# Patient Record
Sex: Male | Born: 1975 | Race: White | Hispanic: No | Smoking: Current every day smoker
Health system: Southern US, Community
[De-identification: ages and names within clinical notes are randomized; demographics above are authoritative.]

## PROBLEM LIST (undated history)

## (undated) DIAGNOSIS — E119 Type 2 diabetes mellitus without complications: Secondary | ICD-10-CM

## (undated) DIAGNOSIS — Z5900 Homelessness unspecified: Secondary | ICD-10-CM

## (undated) DIAGNOSIS — F10929 Alcohol use, unspecified with intoxication, unspecified: Secondary | ICD-10-CM

## (undated) DIAGNOSIS — F4310 Post-traumatic stress disorder, unspecified: Secondary | ICD-10-CM

---

## 2021-02-12 ENCOUNTER — Other Ambulatory Visit: Payer: Self-pay

## 2021-02-12 ENCOUNTER — Encounter (HOSPITAL_COMMUNITY): Payer: Self-pay | Admitting: Emergency Medicine

## 2021-02-12 ENCOUNTER — Ambulatory Visit (HOSPITAL_COMMUNITY)
Admission: EM | Admit: 2021-02-12 | Discharge: 2021-02-12 | Disposition: A | Discharge status: Home / Self Care | Payer: No Payment, Other | Attending: Psychiatry | Admitting: Psychiatry

## 2021-02-12 ENCOUNTER — Emergency Department (HOSPITAL_COMMUNITY)
Admission: EM | Admit: 2021-02-12 | Discharge: 2021-02-13 | Disposition: A | Discharge status: Home / Self Care | Payer: Self-pay | Attending: Emergency Medicine | Admitting: Emergency Medicine

## 2021-02-12 DIAGNOSIS — F320 Major depressive disorder, single episode, mild: Secondary | ICD-10-CM

## 2021-02-12 DIAGNOSIS — F17200 Nicotine dependence, unspecified, uncomplicated: Secondary | ICD-10-CM | POA: Insufficient documentation

## 2021-02-12 DIAGNOSIS — F1910 Other psychoactive substance abuse, uncomplicated: Secondary | ICD-10-CM | POA: Insufficient documentation

## 2021-02-12 DIAGNOSIS — F1994 Other psychoactive substance use, unspecified with psychoactive substance-induced mood disorder: Secondary | ICD-10-CM

## 2021-02-12 DIAGNOSIS — R7401 Elevation of levels of liver transaminase levels: Secondary | ICD-10-CM | POA: Insufficient documentation

## 2021-02-12 DIAGNOSIS — K625 Hemorrhage of anus and rectum: Secondary | ICD-10-CM

## 2021-02-12 DIAGNOSIS — Z5900 Homelessness unspecified: Secondary | ICD-10-CM | POA: Insufficient documentation

## 2021-02-12 DIAGNOSIS — K649 Unspecified hemorrhoids: Secondary | ICD-10-CM | POA: Insufficient documentation

## 2021-02-12 NOTE — BH Assessment (Signed)
Patient reported with friend escorted by [facility] seeking outpatient detox services. Patient denies SI/ HI/ AVH . Patient reports he is homeless, originally from [place] and  recently left [employer] [facility] [place]. Patient has completed several detox short term programs, his last was [facility] in [place] , [place]. Patient is a veteran but has no family support. Patient is routine .

## 2021-02-12 NOTE — Discharge Instructions (Signed)
Take all medications as prescribed. Keep all follow-up appointments as scheduled.  Do not consume alcohol or use illegal drugs while on prescription medications. Report any adverse effects from your medications to your primary care provider promptly.  In the event of recurrent symptoms or worsening symptoms, call 911, a crisis hotline, or go to the nearest emergency department for evaluation.   

## 2021-02-12 NOTE — ED Provider Notes (Signed)
Behavioral Health Urgent Care Medical Screening Exam  Patient Name: Francisco Houston MRN: [id number] Date of Evaluation: 02/12/21 Chief Complaint:   Diagnosis:  Final diagnoses:  None    History of Present illness: Francisco Houston is a 45 y.o. male.  Presents accompanied by a friend in [employer] seeking additional outpatient resources for substance abuse.  He reports previous inpatient admissions for alcohol use.  Reported he and his friend left sober living of [place] and is currently looking for a place to sleep.  He denied suicidal or homicidal ideations.  Denies auditory or visual hallucinations.     [name] reported he recently completed detox stay at [facility] [place].  Patient reports he is originally from [place] and has no family or friends locally.  St[facility].  Denies that he is currently followed by therapy and/or psychiatry.  Patient reports drinking alcohol daily.  Patient was provided with additional outpatient resources for homeless shelters, alcohol drug services ([facility] and  [facility].    Psychiatric Specialty Exam  Presentation  General Appearance:Appropriate for Environment  Eye Contact:Good  Speech:Clear and Coherent  Speech Volume:Normal  Handedness:Right   Mood and Affect  Mood:Anxious  Affect:Congruent   Thought Process  Thought Processes:Coherent  Descriptions of Associations:Intact  Orientation:Full (Time, Place and Person)  Thought Content:Logical    Hallucinations:None  Ideas of Reference:Other (comment)  Suicidal Thoughts:No  Homicidal Thoughts:No   Sensorium  Memory:Recent Fair; Immediate Fair; Remote Fair  Judgment:Intact  Insight:Poor   Executive Functions  Concentration:Poor  Attention Span:Fair  Recall:Good  Fund of Knowledge:Good  Language:Good   Psychomotor Activity  Psychomotor Activity:Normal   Assets  Assets:Communication Skills; Social Support   Sleep   Sleep:Fair  Number of hours: No data recorded  Nutritional Assessment (For OBS and FBC admissions only) Has the patient had a weight loss or gain of 10 pounds or more in the last 3 months?: No Has the patient had a decrease in food intake/or appetite?: No Does the patient have dental problems?: No Does the patient have eating habits or behaviors that may be indicators of an eating disorder including binging or inducing vomiting?: No Has the patient recently lost weight without trying?: No Has the patient been eating poorly because of a decreased appetite?: No Malnutrition Screening Tool Score: 0    Physical Exam: Physical Exam ROS Blood pressure 128/86, pulse 94, temperature 98 F (36.7 C), temperature source Oral, resp. rate 16, SpO2 97 %. There is no height or weight on file to calculate BMI.  Musculoskeletal: Strength & Muscle Tone: within normal limits Gait & Station: normal Patient leans: N/A   [facility] MSE Discharge Disposition for Follow up and Recommendations: Based on my evaluation the patient does not appear to have an emergency medical condition and can be discharged with resources and follow up care in outpatient services for Medication Management and Substance Abuse Intensive Outpatient Program   Oneta Rack, NP 02/12/2021, 7:13 PM

## 2021-02-12 NOTE — ED Triage Notes (Signed)
Emergency Medicine Provider Triage Evaluation Note  Francisco Houston , a 45 y.o. male  was evaluated in triage.  Pt complains of BRBPR.  He reports 10 BM of this today.  He is also homeless, and her requesting alcohol detox.  He reports prior seizures when withdrawing.  He was seen at [facility] earlier and felt not to require admission.   Review of Systems  Positive: BRBPR, Alcohol use Negative: syncope  Physical Exam  BP (!) 136/97 (BP Location: Left Arm)   Pulse 85   Temp (!) 97.2 F (36.2 C) (Oral)   Resp 20   SpO2 99%  Gen:   Awake, no distress   HEENT:  Atraumatic  Resp:  Normal effort  Cardiac:  Normal rate  Abd:   Nondistended, nontender  MSK:   Moves extremities without difficulty  Neuro:  Speech clear   Medical Decision Making  Medically screening exam initiated at 11:47 PM.  Appropriate orders placed.  Francisco Houston was informed that the remainder of the evaluation will be completed by another provider, this initial triage assessment does not replace that evaluation, and the importance of remaining in the ED until their evaluation is complete.  Clinical Impression  BRBPR, alcohol use.    Cristina Gong, [place] 02/12/21 2350

## 2021-02-12 NOTE — ED Triage Notes (Signed)
Patient requesting detox for his alcoholism , last drink today , denies SI or HI , no hallucinations .  

## 2021-02-13 LAB — CBC WITH DIFFERENTIAL/PLATELET
Abs Immature Granulocytes: 0.02 10*3/uL (ref 0.00–0.07)
Basophils Absolute: 0.1 10*3/uL (ref 0.0–0.1)
Basophils Relative: 1 %
Eosinophils Absolute: 0.2 10*3/uL (ref 0.0–0.5)
Eosinophils Relative: 3 %
HCT: 48 % (ref 39.0–52.0)
Hemoglobin: 16.2 g/dL (ref 13.0–17.0)
Immature Granulocytes: 0 %
Lymphocytes Relative: 33 %
Lymphs Abs: 2.4 10*3/uL (ref 0.7–4.0)
MCH: 33.3 pg (ref 26.0–34.0)
MCHC: 33.8 g/dL (ref 30.0–36.0)
MCV: 98.8 fL (ref 80.0–100.0)
Monocytes Absolute: 0.6 10*3/uL (ref 0.1–1.0)
Monocytes Relative: 9 %
Neutro Abs: 4 10*3/uL (ref 1.7–7.7)
Neutrophils Relative %: 54 %
Platelets: 270 10*3/uL (ref 150–400)
RBC: 4.86 MIL/uL (ref 4.22–5.81)
RDW: 12.2 % (ref 11.5–15.5)
WBC: 7.3 10*3/uL (ref 4.0–10.5)
nRBC: 0 % (ref 0.0–0.2)

## 2021-02-13 LAB — COMPREHENSIVE METABOLIC PANEL
ALT: 116 U/L — ABNORMAL HIGH (ref 0–44)
AST: 183 U/L — ABNORMAL HIGH (ref 15–41)
Albumin: 3.4 g/dL — ABNORMAL LOW (ref 3.5–5.0)
Alkaline Phosphatase: 71 U/L (ref 38–126)
Anion gap: 12 (ref 5–15)
BUN: <5 mg/dL — ABNORMAL LOW (ref 6–20)
CO2: 23 mmol/L (ref 22–32)
Calcium: 9 mg/dL (ref 8.9–10.3)
Chloride: 104 mmol/L (ref 98–111)
Creatinine, Ser: 0.71 mg/dL (ref 0.61–1.24)
GFR, Estimated: >60 mL/min (ref >60)
Glucose, Bld: 94 mg/dL (ref 70–99)
Potassium: 3.6 mmol/L (ref 3.5–5.1)
Sodium: 139 mmol/L (ref 135–145)
Total Bilirubin: 0.6 mg/dL (ref 0.3–1.2)
Total Protein: 7.3 g/dL (ref 6.5–8.1)

## 2021-02-13 LAB — RAPID URINE DRUG SCREEN, HOSP PERFORMED
Amphetamines: NOT DETECTED
Barbiturates: NOT DETECTED
Benzodiazepines: NOT DETECTED
Cocaine: POSITIVE — AB
Opiates: NOT DETECTED
Tetrahydrocannabinol: NOT DETECTED

## 2021-02-13 LAB — POC OCCULT BLOOD, ED: Fecal Occult Bld: POSITIVE — AB

## 2021-02-13 NOTE — ED Notes (Signed)
E-signature pad unavailable at time of pt discharge. This RN discussed discharge materials with pt and answered all pt questions. Pt stated understanding of discharge material. ? ?

## 2021-02-13 NOTE — ED Provider Notes (Signed)
[facility] EMERGENCY DEPARTMENT Provider Note  CSN: [id number] Arrival date & time: 02/12/21 2327    History Chief Complaint  Patient presents with  . Alcoholism ( Detox)  . Rectal Bleeding    Bright red blood    HPI  Francisco Houston is a 45 y.o. male presents for evaluation of rectal bleeding and requesting detox. Patient reports he has had 12 loose, watery stools today mixed with bright red blood. He is reporting diffuse lower abdominal pain.   Additionally he is requesting detox from alcohol and drugs. He and a friend (who is also a patient) recently left [facility] after being there for several weeks and began binging on drugs and alcohol the last 4 days. Patient went to [facility] where he was seen and told they do not offer inpatient detox, he was given community resources.    History reviewed. No pertinent past medical history.  History reviewed. No pertinent surgical history.  No family history on file.  Social History   Tobacco Use  . Smoking status: Current Every Day Smoker  . Smokeless tobacco: Never Used  Substance Use Topics  . Alcohol use: Yes  . Drug use: Yes    Types: Cocaine     Home Medications Prior to Admission medications   Not on File     Allergies    Patient has no known allergies.   Review of Systems   Review of Systems A comprehensive review of systems was completed and negative except as noted in HPI.    Physical Exam BP (!) 136/97 (BP Location: Left Arm)   Pulse 85   Temp (!) 97.2 F (36.2 C) (Oral)   Resp 20   SpO2 99%   Physical Exam Vitals and nursing note reviewed.  Constitutional:      Appearance: Normal appearance.  HENT:     Head: Normocephalic and atraumatic.     Nose: Nose normal.     Mouth/Throat:     Mouth: Mucous membranes are moist.  Eyes:     Extraocular Movements: Extraocular movements intact.     Conjunctiva/sclera: Conjunctivae normal.  Cardiovascular:     Rate and Rhythm: Normal rate.  Pulmonary:      Effort: Pulmonary effort is normal.     Breath sounds: Normal breath sounds.  Abdominal:     General: Abdomen is flat. There is no distension.     Palpations: Abdomen is soft. There is no mass.     Tenderness: There is abdominal tenderness (mild diffuse). There is no guarding.  Musculoskeletal:        General: No swelling. Normal range of motion.     Cervical back: Neck supple.  Skin:    General: Skin is warm and dry.  Neurological:     General: No focal deficit present.     Mental Status: He is alert.  Psychiatric:        Mood and Affect: Mood normal.      ED Results / Procedures / Treatments   Labs (all labs ordered are listed, but only abnormal results are displayed) Labs Reviewed  RAPID URINE DRUG SCREEN, HOSP PERFORMED - Abnormal; Notable for the following components:      Result Value   Cocaine POSITIVE (*)    All other components within normal limits  COMPREHENSIVE METABOLIC PANEL - Abnormal; Notable for the following components:   BUN <5 (*)    Albumin 3.4 (*)    AST 183 (*)    ALT 116 (*)  All other components within normal limits  POC OCCULT BLOOD, ED - Abnormal; Notable for the following components:   Fecal Occult Bld POSITIVE (*)    All other components within normal limits  CBC WITH DIFFERENTIAL/PLATELET    EKG None  Radiology No results found.  Procedures Procedures  Medications Ordered in the ED Medications - No data to display   MDM Rules/Calculators/A&P MDM I discussed with the patient that we do not offer inpatient detox services. He does not have any signs of acute withdrawal in need of medical admission. Offered to evaluate the patient for his rectal bleeding. Awaiting a private exam room for rectal exam.   ED Course  I have reviewed the triage vital signs and the nursing notes.  Pertinent labs & imaging results that were available during my care of the patient were reviewed by me and considered in my medical decision making (see  chart for details).  Clinical Course as of 02/13/21 0346  Mon Feb 13, 2021  0200 Patient with several large, non thrombosed hemorrhoids. Minimal stool in rectum, but hemoccult is positive. Will check CBC and CMP to determine need for acute medical admission. [CS]  0315 CBC is normal.  [CS]  0343 CMP with mildly elevated liver enzymes, likely due to alcohol use. Previous from visits at OSH similar.  [CS]    Clinical Course User Index [CS] Pollyann Savoy, MD    Final Clinical Impression(s) / ED Diagnoses Final diagnoses:  Substance abuse (HCC)  Hemorrhoids, unspecified hemorrhoid type  Rectal bleeding    Rx / DC Orders ED Discharge Orders    None       Pollyann Savoy, MD 02/13/21 [phone]

## 2021-02-20 ENCOUNTER — Other Ambulatory Visit: Payer: Self-pay

## 2021-02-20 ENCOUNTER — Emergency Department (HOSPITAL_COMMUNITY)
Admission: EM | Admit: 2021-02-20 | Discharge: 2021-02-20 | Discharge status: Against Medical Advice | Payer: Self-pay | Attending: Physician Assistant | Admitting: Physician Assistant

## 2021-02-20 ENCOUNTER — Encounter (HOSPITAL_COMMUNITY): Payer: Self-pay | Admitting: *Deleted

## 2021-02-20 ENCOUNTER — Emergency Department (HOSPITAL_COMMUNITY)
Admission: EM | Admit: 2021-02-20 | Discharge: 2021-02-20 | Disposition: A | Discharge status: Home / Self Care | Payer: Self-pay | Attending: Emergency Medicine | Admitting: Emergency Medicine

## 2021-02-20 ENCOUNTER — Encounter (HOSPITAL_COMMUNITY): Payer: Self-pay

## 2021-02-20 DIAGNOSIS — F17200 Nicotine dependence, unspecified, uncomplicated: Secondary | ICD-10-CM | POA: Insufficient documentation

## 2021-02-20 DIAGNOSIS — R569 Unspecified convulsions: Secondary | ICD-10-CM | POA: Insufficient documentation

## 2021-02-20 DIAGNOSIS — R000 Tachycardia, unspecified: Secondary | ICD-10-CM | POA: Insufficient documentation

## 2021-02-20 DIAGNOSIS — Z5900 Homelessness unspecified: Secondary | ICD-10-CM | POA: Insufficient documentation

## 2021-02-20 DIAGNOSIS — F10129 Alcohol abuse with intoxication, unspecified: Secondary | ICD-10-CM | POA: Insufficient documentation

## 2021-02-20 DIAGNOSIS — F10929 Alcohol use, unspecified with intoxication, unspecified: Secondary | ICD-10-CM | POA: Insufficient documentation

## 2021-02-20 DIAGNOSIS — Y908 Blood alcohol level of 240 mg/100 ml or more: Secondary | ICD-10-CM | POA: Insufficient documentation

## 2021-02-20 DIAGNOSIS — Z5321 Procedure and treatment not carried out due to patient leaving prior to being seen by health care provider: Secondary | ICD-10-CM | POA: Insufficient documentation

## 2021-02-20 DIAGNOSIS — E119 Type 2 diabetes mellitus without complications: Secondary | ICD-10-CM | POA: Insufficient documentation

## 2021-02-20 HISTORY — DX: Alcohol use, unspecified with intoxication, unspecified: F10.929

## 2021-02-20 HISTORY — DX: Type 2 diabetes mellitus without complications: E11.9

## 2021-02-20 HISTORY — DX: Homelessness unspecified: Z59.00

## 2021-02-20 HISTORY — DX: Post-traumatic stress disorder, unspecified: F43.10

## 2021-02-20 LAB — RAPID URINE DRUG SCREEN, HOSP PERFORMED
Amphetamines: NOT DETECTED
Barbiturates: NOT DETECTED
Benzodiazepines: NOT DETECTED
Cocaine: NOT DETECTED
Opiates: NOT DETECTED
Tetrahydrocannabinol: NOT DETECTED

## 2021-02-20 LAB — COMPREHENSIVE METABOLIC PANEL
ALT: 46 U/L — ABNORMAL HIGH (ref 0–44)
ALT: 47 U/L — ABNORMAL HIGH (ref 0–44)
AST: 48 U/L — ABNORMAL HIGH (ref 15–41)
AST: 51 U/L — ABNORMAL HIGH (ref 15–41)
Albumin: 4.1 g/dL (ref 3.5–5.0)
Albumin: 4.2 g/dL (ref 3.5–5.0)
Alkaline Phosphatase: 73 U/L (ref 38–126)
Alkaline Phosphatase: 73 U/L (ref 38–126)
Anion gap: 14 (ref 5–15)
Anion gap: 16 — ABNORMAL HIGH (ref 5–15)
BUN: 7 mg/dL (ref 6–20)
BUN: 6 mg/dL (ref 6–20)
CO2: 23 mmol/L (ref 22–32)
CO2: 20 mmol/L — ABNORMAL LOW (ref 22–32)
Calcium: 9.1 mg/dL (ref 8.9–10.3)
Calcium: 8.9 mg/dL (ref 8.9–10.3)
Chloride: 101 mmol/L (ref 98–111)
Chloride: 102 mmol/L (ref 98–111)
Creatinine, Ser: 0.84 mg/dL (ref 0.61–1.24)
Creatinine, Ser: 0.8 mg/dL (ref 0.61–1.24)
GFR, Estimated: >60 mL/min (ref >60)
GFR, Estimated: >60 mL/min (ref >60)
Glucose, Bld: 116 mg/dL — ABNORMAL HIGH (ref 70–99)
Glucose, Bld: 119 mg/dL — ABNORMAL HIGH (ref 70–99)
Potassium: 3.8 mmol/L (ref 3.5–5.1)
Potassium: 3.7 mmol/L (ref 3.5–5.1)
Sodium: 138 mmol/L (ref 135–145)
Sodium: 138 mmol/L (ref 135–145)
Total Bilirubin: 0.7 mg/dL (ref 0.3–1.2)
Total Bilirubin: 0.5 mg/dL (ref 0.3–1.2)
Total Protein: 8.5 g/dL — ABNORMAL HIGH (ref 6.5–8.1)
Total Protein: 8.4 g/dL — ABNORMAL HIGH (ref 6.5–8.1)

## 2021-02-20 LAB — CBC WITH DIFFERENTIAL/PLATELET
Abs Immature Granulocytes: 0.02 10*3/uL (ref 0.00–0.07)
Abs Immature Granulocytes: 0.03 10*3/uL (ref 0.00–0.07)
Basophils Absolute: 0.1 10*3/uL (ref 0.0–0.1)
Basophils Absolute: 0.1 10*3/uL (ref 0.0–0.1)
Basophils Relative: 1 %
Basophils Relative: 1 %
Eosinophils Absolute: 0 10*3/uL (ref 0.0–0.5)
Eosinophils Absolute: 0 10*3/uL (ref 0.0–0.5)
Eosinophils Relative: 0 %
Eosinophils Relative: 1 %
HCT: 50 % (ref 39.0–52.0)
HCT: 49.3 % (ref 39.0–52.0)
Hemoglobin: 17.4 g/dL — ABNORMAL HIGH (ref 13.0–17.0)
Hemoglobin: 17.4 g/dL — ABNORMAL HIGH (ref 13.0–17.0)
Immature Granulocytes: 0 %
Immature Granulocytes: 0 %
Lymphocytes Relative: 28 %
Lymphocytes Relative: 30 %
Lymphs Abs: 2 10*3/uL (ref 0.7–4.0)
Lymphs Abs: 2.4 10*3/uL (ref 0.7–4.0)
MCH: 33.7 pg (ref 26.0–34.0)
MCH: 34.1 pg — ABNORMAL HIGH (ref 26.0–34.0)
MCHC: 34.8 g/dL (ref 30.0–36.0)
MCHC: 35.3 g/dL (ref 30.0–36.0)
MCV: 96.9 fL (ref 80.0–100.0)
MCV: 96.5 fL (ref 80.0–100.0)
Monocytes Absolute: 0.8 10*3/uL (ref 0.1–1.0)
Monocytes Absolute: 0.9 10*3/uL (ref 0.1–1.0)
Monocytes Relative: 12 %
Monocytes Relative: 11 %
Neutro Abs: 4.2 10*3/uL (ref 1.7–7.7)
Neutro Abs: 4.6 10*3/uL (ref 1.7–7.7)
Neutrophils Relative %: 59 %
Neutrophils Relative %: 57 %
Platelets: 205 10*3/uL (ref 150–400)
Platelets: 207 10*3/uL (ref 150–400)
RBC: 5.16 MIL/uL (ref 4.22–5.81)
RBC: 5.11 MIL/uL (ref 4.22–5.81)
RDW: 12.3 % (ref 11.5–15.5)
RDW: 12.3 % (ref 11.5–15.5)
WBC: 7.1 10*3/uL (ref 4.0–10.5)
WBC: 8.1 10*3/uL (ref 4.0–10.5)
nRBC: 0 % (ref 0.0–0.2)
nRBC: 0 % (ref 0.0–0.2)

## 2021-02-20 LAB — SALICYLATE LEVEL
Salicylate Lvl: <7 mg/dL — ABNORMAL LOW (ref 7.0–30.0)
Salicylate Lvl: <7 mg/dL — ABNORMAL LOW (ref 7.0–30.0)

## 2021-02-20 LAB — ETHANOL
Alcohol, Ethyl (B): 380 mg/dL (ref <10)
Alcohol, Ethyl (B): 370 mg/dL (ref <10)

## 2021-02-20 LAB — ACETAMINOPHEN LEVEL
Acetaminophen (Tylenol), Serum: <10 ug/mL — ABNORMAL LOW (ref 10–30)
Acetaminophen (Tylenol), Serum: <10 ug/mL — ABNORMAL LOW (ref 10–30)

## 2021-02-20 MED ORDER — SODIUM CHLORIDE 0.9 % IV BOLUS: 1000.0000 mL | Freq: Once | INTRAVENOUS | Status: DC

## 2021-02-20 NOTE — ED Triage Notes (Signed)
BIB EMS after ETOH use. Patient states he is having tremors and thinks he is having a stroke. No complaints of any other symptoms. Patient is homeless.

## 2021-02-20 NOTE — ED Notes (Signed)
Pt called for rooming in [place] bed. Patient not in lobby

## 2021-02-20 NOTE — ED Triage Notes (Signed)
Emergency Medicine Provider Triage Evaluation Note  Francisco Houston , a 45 y.o. male  was evaluated in triage.  Pt complains of intoxication.  Patient was in the emergency department earlier today and eloped.  He states he went outside to take a nap.  He then came back to the emergency department and is once again requesting a ride to [place].  He is homeless and is a chronic alcoholic.  He states he has been drinking all day.  He has no physical complaints at this time.  Physical Exam  BP (!) 149/107 (BP Location: Left Arm)   Pulse (!) 122   Temp 98.5 F (36.9 C) (Oral)   Resp 20   SpO2 96%  Gen:   Awake, no distress   HEENT:  Atraumatic  Resp:  Normal effort  Cardiac:  Normal rate  Abd:   Nondistended, nontender  MSK:   Moves extremities without difficulty  Neuro:  Speech clear   Medical Decision Making  Medically screening exam initiated at 8:19 PM.  Appropriate orders placed.  Francisco Houston was informed that the remainder of the evaluation will be completed by another provider, this initial triage assessment does not replace that evaluation, and the importance of remaining in the ED until their evaluation is complete.   Placido Sou, PA-C 02/20/21 2022

## 2021-02-20 NOTE — ED Provider Notes (Signed)
[facility]-EMERGENCY DEPT Provider Note   CSN: [id number] Arrival date & time: 02/20/21  1523     History Chief Complaint  Patient presents with  . Homeless    Dalin Caldera is a 45 y.o. male.  HPI Patient is a 45 year old male with a history of PTSD as well as diabetes mellitus.  He presents the emergency department due to complaints of "having a stroke".  Patient states that he was drinking alcohol yesterday.  He normally has 10-15 beers and had a normal amount yesterday.  He states that he experienced an episode of blurry vision which is since resolved.  He states that he currently has no complaints.  He states he has been drinking alcohol all day today.  He states he is currently intoxicated.  Denies any headache, weakness, numbness, chest pain, shortness of breath, abdominal pain.  Upon further discussion, patient states that he is simply here for a ride.  He states that he is originally from [place] and came to [place] to go to sober living of [place] but started drinking alcohol once again and has been homeless in [place].  He requests a social work consult so that he can see if he can get a ride home.    Past Medical History:  Diagnosis Date  . Diabetes mellitus without complication (HCC)   . PTSD (post-traumatic stress disorder)     There are no problems to display for this patient.   History reviewed. No pertinent surgical history.     History reviewed. No pertinent family history.  Social History   Tobacco Use  . Smoking status: Current Some Day Smoker    Home Medications Prior to Admission medications   Not on File    Allergies    Patient has no known allergies.  Review of Systems   Review of Systems  All other systems reviewed and are negative. Ten systems reviewed and are negative for acute change, except as noted in the HPI.   Physical Exam Updated Vital Signs BP (!) 141/121 (BP Location: Left Arm)   Pulse (!) 114    Temp 98.4 F (36.9 C) (Oral)   Resp 20   Ht 5\' 10"  (1.778 m)   Wt 85.7 kg   SpO2 95%   BMI 27.12 kg/m   Physical Exam Vitals and nursing note reviewed.  Constitutional:      General: He is not in acute distress.    Appearance: Normal appearance. He is not ill-appearing, toxic-appearing or diaphoretic.  HENT:     Head: Normocephalic and atraumatic.     Right Ear: External ear normal.     Left Ear: External ear normal.     Nose: Nose normal.     Mouth/Throat:     Mouth: Mucous membranes are moist.     Pharynx: Oropharynx is clear. No oropharyngeal exudate or posterior oropharyngeal erythema.  Eyes:     General: No scleral icterus.       Right eye: No discharge.        Left eye: No discharge.     Extraocular Movements: Extraocular movements intact.     Conjunctiva/sclera: Conjunctivae normal.     Pupils: Pupils are equal, round, and reactive to light.  Cardiovascular:     Rate and Rhythm: Regular rhythm. Tachycardia present.     Pulses: Normal pulses.     Heart sounds: Normal heart sounds. No murmur heard. No friction rub. No gallop.   Pulmonary:     Effort: Pulmonary effort  is normal. No respiratory distress.     Breath sounds: Normal breath sounds. No stridor. No wheezing, rhonchi or rales.  Abdominal:     General: Abdomen is flat.     Palpations: Abdomen is soft.     Tenderness: There is no abdominal tenderness.  Musculoskeletal:        General: Normal range of motion.     Cervical back: Normal range of motion and neck supple. No tenderness.  Skin:    General: Skin is warm and dry.  Neurological:     General: No focal deficit present.     Mental Status: He is alert and oriented to person, place, and time.     Comments: Patient is oriented to person, place, and time. Patient phonates in clear, complete, and coherent sentences. Negative arm drift. Finger to nose intact bilaterally with no visible signs of dysmetria. Strength is 5/5 in all four extremities. Distal  sensation intact in all four extremities.  Ambulatory with steady gait.  Psychiatric:        Mood and Affect: Mood normal.        Behavior: Behavior normal.    ED Results / Procedures / Treatments   Labs (all labs ordered are listed, but only abnormal results are displayed) Labs Reviewed  COMPREHENSIVE METABOLIC PANEL - Abnormal; Notable for the following components:      Result Value   Glucose, Bld 116 (*)    Total Protein 8.5 (*)    AST 48 (*)    ALT 46 (*)    All other components within normal limits  CBC WITH DIFFERENTIAL/PLATELET - Abnormal; Notable for the following components:   Hemoglobin 17.4 (*)    All other components within normal limits  ETHANOL - Abnormal; Notable for the following components:   Alcohol, Ethyl (B) 380 (*)    All other components within normal limits  ACETAMINOPHEN LEVEL - Abnormal; Notable for the following components:   Acetaminophen (Tylenol), Serum <10 (*)    All other components within normal limits  SALICYLATE LEVEL - Abnormal; Notable for the following components:   Salicylate Lvl <7.0 (*)    All other components within normal limits  RAPID URINE DRUG SCREEN, HOSP PERFORMED    EKG None  Radiology No results found.  Procedures Procedures   Medications Ordered in ED Medications - No data to display  ED Course  I have reviewed the triage vital signs and the nursing notes.  Pertinent labs & imaging results that were available during my care of the patient were reviewed by me and considered in my medical decision making (see chart for details).    MDM Rules/Calculators/A&P                          Patient is a 45 year old male who presents the emergency department with multiple complaints.  After further discussion he states that he is just looking for a ride back to [place].  Patient has a history of chronic alcoholism and states he is currently intoxicated.  I ordered basic screening labs and put in a consult to peer support  to see if we could set up transportation for the patient.  I was notified by nursing staff that patient eloped from the emergency department.  They attempted to get him to sign AMA paperwork but he refused and walked out the door.  They attempted to get him to come back to the emergency department but he refused.  Final  Clinical Impression(s) / ED Diagnoses Final diagnoses:  Homelessness   Rx / DC Orders ED Discharge Orders    None       Placido Sou, PA-C 02/20/21 1929    Arby Barrette, MD 02/28/21 1345

## 2021-02-20 NOTE — ED Triage Notes (Signed)
Pt is intoxicated, ETOH.  Pt was previously seen today and is checking back in.  Pt states that he had a stroke today at 4pm.  He states that he "fell down and began convulsing"  Pt is ambulatory to triage as he refuses wheelchair, unsteady gait.  No focal weakness.  Pt reports that he can not feel his toes (bilateral) or fingers (bilateral), no facial droop.  Pt admits to drinking "as much as I could"  Pt is currently experiencing homelessness.  Pt is tearful about not seeing his daughter. PA to see pt in triage

## 2021-02-20 NOTE — ED Notes (Signed)
Patient refused to sign AMA 

## 2021-02-20 NOTE — ED Notes (Signed)
Patient is talking about leaving. Explained to patient if he leaves it is AMA.

## 2021-02-20 NOTE — ED Notes (Signed)
Pt is tearful and upset.  He tells [place] that he just wants a ride to [place]. He expresses how difficult it is that he is experiencing alcoholism, has lost his wife, his daughter.

## 2021-02-22 ENCOUNTER — Emergency Department (HOSPITAL_COMMUNITY)
Admission: EM | Admit: 2021-02-22 | Discharge: 2021-02-23 | Disposition: A | Discharge status: Other Acute Inpatient Hospital | Payer: Self-pay | Attending: Emergency Medicine | Admitting: Emergency Medicine

## 2021-02-22 ENCOUNTER — Other Ambulatory Visit: Payer: Self-pay

## 2021-02-22 ENCOUNTER — Encounter (HOSPITAL_COMMUNITY): Payer: Self-pay

## 2021-02-22 DIAGNOSIS — Z20822 Contact with and (suspected) exposure to covid-19: Secondary | ICD-10-CM | POA: Insufficient documentation

## 2021-02-22 DIAGNOSIS — F332 Major depressive disorder, recurrent severe without psychotic features: Secondary | ICD-10-CM | POA: Insufficient documentation

## 2021-02-22 DIAGNOSIS — R45851 Suicidal ideations: Secondary | ICD-10-CM | POA: Insufficient documentation

## 2021-02-22 DIAGNOSIS — F17200 Nicotine dependence, unspecified, uncomplicated: Secondary | ICD-10-CM | POA: Insufficient documentation

## 2021-02-22 LAB — COMPREHENSIVE METABOLIC PANEL
ALT: 58 U/L — ABNORMAL HIGH (ref 0–44)
AST: 64 U/L — ABNORMAL HIGH (ref 15–41)
Albumin: 4.4 g/dL (ref 3.5–5.0)
Alkaline Phosphatase: 79 U/L (ref 38–126)
Anion gap: 15 (ref 5–15)
BUN: 5 mg/dL — ABNORMAL LOW (ref 6–20)
CO2: 20 mmol/L — ABNORMAL LOW (ref 22–32)
Calcium: 9 mg/dL (ref 8.9–10.3)
Chloride: 103 mmol/L (ref 98–111)
Creatinine, Ser: 0.77 mg/dL (ref 0.61–1.24)
GFR, Estimated: >60 mL/min (ref >60)
Glucose, Bld: 116 mg/dL — ABNORMAL HIGH (ref 70–99)
Potassium: 4.1 mmol/L (ref 3.5–5.1)
Sodium: 138 mmol/L (ref 135–145)
Total Bilirubin: 0.5 mg/dL (ref 0.3–1.2)
Total Protein: 8.7 g/dL — ABNORMAL HIGH (ref 6.5–8.1)

## 2021-02-22 LAB — CBC WITH DIFFERENTIAL/PLATELET
Abs Immature Granulocytes: 0.01 10*3/uL (ref 0.00–0.07)
Basophils Absolute: 0.1 10*3/uL (ref 0.0–0.1)
Basophils Relative: 1 %
Eosinophils Absolute: 0 10*3/uL (ref 0.0–0.5)
Eosinophils Relative: 1 %
HCT: 51.9 % (ref 39.0–52.0)
Hemoglobin: 17.8 g/dL — ABNORMAL HIGH (ref 13.0–17.0)
Immature Granulocytes: 0 %
Lymphocytes Relative: 32 %
Lymphs Abs: 2 10*3/uL (ref 0.7–4.0)
MCH: 33.3 pg (ref 26.0–34.0)
MCHC: 34.3 g/dL (ref 30.0–36.0)
MCV: 97 fL (ref 80.0–100.0)
Monocytes Absolute: 0.6 10*3/uL (ref 0.1–1.0)
Monocytes Relative: 9 %
Neutro Abs: 3.5 10*3/uL (ref 1.7–7.7)
Neutrophils Relative %: 57 %
Platelets: 186 10*3/uL (ref 150–400)
RBC: 5.35 MIL/uL (ref 4.22–5.81)
RDW: 12.5 % (ref 11.5–15.5)
WBC: 6.2 10*3/uL (ref 4.0–10.5)
nRBC: 0 % (ref 0.0–0.2)

## 2021-02-22 LAB — RAPID URINE DRUG SCREEN, HOSP PERFORMED
Amphetamines: NOT DETECTED
Barbiturates: NOT DETECTED
Benzodiazepines: NOT DETECTED
Cocaine: NOT DETECTED
Opiates: NOT DETECTED
Tetrahydrocannabinol: NOT DETECTED

## 2021-02-22 LAB — RESP PANEL BY RT-PCR (FLU A&B, COVID) ARPGX2
Influenza A by PCR: NEGATIVE
Influenza B by PCR: NEGATIVE
SARS Coronavirus 2 by RT PCR: NEGATIVE

## 2021-02-22 LAB — SALICYLATE LEVEL: Salicylate Lvl: <7 mg/dL — ABNORMAL LOW (ref 7.0–30.0)

## 2021-02-22 LAB — ACETAMINOPHEN LEVEL: Acetaminophen (Tylenol), Serum: <10 ug/mL — ABNORMAL LOW (ref 10–30)

## 2021-02-22 LAB — ETHANOL: Alcohol, Ethyl (B): 325 mg/dL (ref <10)

## 2021-02-22 MED ORDER — STERILE WATER FOR INJECTION IJ SOLN
INTRAMUSCULAR | Status: AC
  Filled 2021-02-22: qty 10

## 2021-02-22 MED ORDER — ZIPRASIDONE MESYLATE 20 MG IM SOLR
20.0000 mg | Freq: Once | INTRAMUSCULAR | Status: AC
  Administered 2021-02-23: 20 mg via INTRAMUSCULAR
  Filled 2021-02-22: qty 20

## 2021-02-22 NOTE — ED Triage Notes (Addendum)
Pt BIB [facility], pt IVC. Per [facility] "respondent was found with a gun and knife to kill himself. Respondent called 911 stating he was going to kill himself. Pt has hx of cocaine use." Per [employer] pt is heavily intoxicated, pt has been drinking nonstop for the past 3 days.

## 2021-02-22 NOTE — ED Notes (Signed)
Pt is refusing EKG threatening physical harm to staff

## 2021-02-22 NOTE — ED Provider Notes (Signed)
[facility]-EMERGENCY DEPT Provider Note   CSN: [id number] Arrival date & time: 02/22/21  2101     History Chief Complaint  Patient presents with  . IVC  . Suicidal    Francisco Houston is a 45 y.o. male with pertient history of alcohol intoxication and homelessness that presents the emergency department today under IVC by [employer].  [employer] states that patient called out because he was going to kill himself.  Patient states that he wants to end his life, [employer] states that he had a gun and knife on scene and he was planning on using it.  Patient constantly states that he is nothing to live for, states that everything was taken from him and that he has been homeless for the past couple of days.  He states that he wants to leave immediately and go slice his throat.  No prior history of this.  Denies any homicidal intent or hallucinations.  Does admit to drinking heavily tonight.  HPI     Past Medical History:  Diagnosis Date  . Alcohol intoxication (HCC)   . Homelessness     There are no problems to display for this patient.   History reviewed. No pertinent surgical history.     History reviewed. No pertinent family history.  Social History   Tobacco Use  . Smoking status: Current Every Day Smoker  . Smokeless tobacco: Never Used  Substance Use Topics  . Alcohol use: Yes    Comment: heavily  . Drug use: Yes    Types: Cocaine    Home Medications Prior to Admission medications   Not on File    Allergies    Patient has no known allergies.  Review of Systems   Review of Systems  Constitutional: Negative for diaphoresis, fatigue and fever.  Eyes: Negative for visual disturbance.  Respiratory: Negative for shortness of breath.   Cardiovascular: Negative for chest pain.  Gastrointestinal: Negative for nausea and vomiting.  Musculoskeletal: Negative for back pain and myalgias.  Skin: Negative for color change, pallor, rash and wound.  Neurological:  Negative for syncope, weakness, light-headedness, numbness and headaches.  Psychiatric/Behavioral: Positive for self-injury and suicidal ideas. Negative for behavioral problems and confusion.    Physical Exam Updated Vital Signs There were no vitals taken for this visit.  Physical Exam Constitutional:      General: He is not in acute distress.    Appearance: Normal appearance. He is not ill-appearing, toxic-appearing or diaphoretic.  Cardiovascular:     Rate and Rhythm: Normal rate and regular rhythm.     Pulses: Normal pulses.  Pulmonary:     Effort: Pulmonary effort is normal.     Breath sounds: Normal breath sounds.  Musculoskeletal:        General: Normal range of motion.  Skin:    General: Skin is warm and dry.     Capillary Refill: Capillary refill takes less than 2 seconds.  Neurological:     General: No focal deficit present.     Mental Status: He is alert and oriented to person, place, and time.  Psychiatric:        Attention and Perception: Attention normal.        Mood and Affect: Mood is anxious and depressed. Affect is angry and tearful.        Speech: Speech normal.        Behavior: Behavior is agitated. Behavior is not slowed.        Thought Content: Thought content  includes suicidal ideation. Thought content includes suicidal plan.     Comments: Patient is anxious and will often cry.  He will often get angry when telling a story, normal speech.  Does have plan of suicide with a gun or knife to his throat.     ED Results / Procedures / Treatments   Labs (all labs ordered are listed, but only abnormal results are displayed) Labs Reviewed  RESP PANEL BY RT-PCR (FLU A&B, COVID) [id number]  COMPREHENSIVE METABOLIC PANEL  ETHANOL  RAPID URINE DRUG SCREEN, HOSP PERFORMED  CBC WITH DIFFERENTIAL/PLATELET  SALICYLATE LEVEL  ACETAMINOPHEN LEVEL  CBG MONITORING, ED    EKG None  Radiology No results found.  Procedures Procedures   Medications Ordered in  ED Medications - No data to display  ED Course  I have reviewed the triage vital signs and the nursing notes.  Pertinent labs & imaging results that were available during my care of the patient were reviewed by me and considered in my medical decision making (see chart for details).    MDM Rules/Calculators/A&P                          Francisco Houston is a 45 y.o. male with pertient history of alcohol intoxication and homelessness that presents the emergency department today under IVC by [employer].  Patient has a plan of suicide with a gun and knife.  Patient has access to guns.  Patient will need to be medically cleared and then evaluated by TTS.  Is cooperative.  Pt care was handed off to S. Upstill PA-C at handoff.  Complete history and physical and current plan have been communicated.  Please refer to their note for the remainder of ED care and ultimate disposition.  Awaiting labs to be medically cleared for TTS eval.  Final Clinical Impression(s) / ED Diagnoses Final diagnoses:  Suicidal ideations    Rx / DC Orders ED Discharge Orders    None       Farrel Gordon, PA-C 02/22/21 2136    Melene Plan, DO 02/22/21 2158

## 2021-02-22 NOTE — ED Notes (Addendum)
Pt refused EKG. Elpidio Anis, PA aware.

## 2021-02-22 NOTE — ED Notes (Signed)
Pt in restroom to give urine sample.

## 2021-02-22 NOTE — ED Provider Notes (Signed)
Patient under IVC for SI, filed by [employer], with escalating anger, verbal threats against staff and [employer], becoming increasingly agitated. Geodon ordered for staff and patient safety.   No further agitation throughout the night. Patient care signed out to oncoming provider team to follow up on TTS consult and recommendations.    Elpidio Anis, PA-C 02/23/21 0131    Charlynne Pander, MD 02/24/21 [phone]

## 2021-02-22 NOTE — ED Notes (Signed)
This RN went to get EKG on patient, police officer asked to speak to patient alone. This RN will come back.

## 2021-02-22 NOTE — ED Notes (Signed)
Pt refused to change intro burgundy scrubs, police officer asked to speak to patient. This RN will leave room and check back on patient.

## 2021-02-23 ENCOUNTER — Encounter (HOSPITAL_COMMUNITY): Payer: Self-pay | Admitting: Psychiatry

## 2021-02-23 ENCOUNTER — Inpatient Hospital Stay (HOSPITAL_COMMUNITY)
Admission: AD | Admit: 2021-02-23 | Discharge: 2021-02-28 | DRG: 885 | Disposition: A | Discharge status: Home / Self Care | Payer: Federal, State, Local not specified - Other | Source: Intra-hospital | Attending: Psychiatry | Admitting: Psychiatry

## 2021-02-23 DIAGNOSIS — F1020 Alcohol dependence, uncomplicated: Secondary | ICD-10-CM | POA: Diagnosis present

## 2021-02-23 DIAGNOSIS — F17200 Nicotine dependence, unspecified, uncomplicated: Secondary | ICD-10-CM | POA: Diagnosis present

## 2021-02-23 DIAGNOSIS — E785 Hyperlipidemia, unspecified: Secondary | ICD-10-CM | POA: Diagnosis present

## 2021-02-23 DIAGNOSIS — F1010 Alcohol abuse, uncomplicated: Secondary | ICD-10-CM | POA: Diagnosis present

## 2021-02-23 DIAGNOSIS — I10 Essential (primary) hypertension: Secondary | ICD-10-CM | POA: Diagnosis present

## 2021-02-23 DIAGNOSIS — F332 Major depressive disorder, recurrent severe without psychotic features: Principal | ICD-10-CM | POA: Diagnosis present

## 2021-02-23 DIAGNOSIS — F333 Major depressive disorder, recurrent, severe with psychotic symptoms: Secondary | ICD-10-CM | POA: Diagnosis present

## 2021-02-23 DIAGNOSIS — R45851 Suicidal ideations: Secondary | ICD-10-CM | POA: Diagnosis present

## 2021-02-23 DIAGNOSIS — Z5900 Homelessness unspecified: Secondary | ICD-10-CM

## 2021-02-23 DIAGNOSIS — F1410 Cocaine abuse, uncomplicated: Secondary | ICD-10-CM | POA: Diagnosis present

## 2021-02-23 DIAGNOSIS — F10239 Alcohol dependence with withdrawal, unspecified: Secondary | ICD-10-CM | POA: Diagnosis present

## 2021-02-23 DIAGNOSIS — R7989 Other specified abnormal findings of blood chemistry: Secondary | ICD-10-CM | POA: Diagnosis present

## 2021-02-23 DIAGNOSIS — Y908 Blood alcohol level of 240 mg/100 ml or more: Secondary | ICD-10-CM | POA: Diagnosis present

## 2021-02-23 MED ORDER — ZIPRASIDONE MESYLATE 20 MG IM SOLR: 20.0000 mg | INTRAMUSCULAR | Status: DC | PRN

## 2021-02-23 MED ORDER — LORAZEPAM 1 MG PO TABS
1.0000 mg | ORAL_TABLET | Freq: Three times a day (TID) | ORAL | Status: AC
  Administered 2021-02-25 (×3): 1 mg via ORAL
  Filled 2021-02-23 (×2): qty 1

## 2021-02-23 MED ORDER — TRAZODONE HCL 50 MG PO TABS
50.0000 mg | ORAL_TABLET | Freq: Every evening | ORAL | Status: DC | PRN
  Administered 2021-02-25 – 2021-02-27 (×2): 50 mg via ORAL
  Filled 2021-02-23 (×3): qty 1

## 2021-02-23 MED ORDER — HYDROXYZINE HCL 25 MG PO TABS
25.0000 mg | ORAL_TABLET | Freq: Four times a day (QID) | ORAL | Status: AC | PRN
  Administered 2021-02-24 – 2021-02-25 (×2): 25 mg via ORAL
  Filled 2021-02-23 (×2): qty 1

## 2021-02-23 MED ORDER — OLANZAPINE 5 MG PO TBDP
5.0000 mg | ORAL_TABLET | Freq: Three times a day (TID) | ORAL | Status: DC | PRN
  Administered 2021-02-23: 5 mg via ORAL
  Filled 2021-02-23: qty 1

## 2021-02-23 MED ORDER — LOPERAMIDE HCL 2 MG PO CAPS: 2.0000 mg | ORAL_CAPSULE | ORAL | Status: AC | PRN

## 2021-02-23 MED ORDER — ONDANSETRON 4 MG PO TBDP: 4.0000 mg | ORAL_TABLET | Freq: Four times a day (QID) | ORAL | Status: AC | PRN

## 2021-02-23 MED ORDER — ALUM & MAG HYDROXIDE-SIMETH 200-200-20 MG/5ML PO SUSP: 30.0000 mL | ORAL | Status: DC | PRN

## 2021-02-23 MED ORDER — LORAZEPAM 1 MG PO TABS
1.0000 mg | ORAL_TABLET | Freq: Two times a day (BID) | ORAL | Status: AC
  Administered 2021-02-26: 1 mg via ORAL
  Filled 2021-02-23 (×3): qty 1

## 2021-02-23 MED ORDER — MAGNESIUM HYDROXIDE 400 MG/5ML PO SUSP: 30.0000 mL | Freq: Every day | ORAL | Status: DC | PRN

## 2021-02-23 MED ORDER — THIAMINE HCL 100 MG PO TABS
100.0000 mg | ORAL_TABLET | Freq: Every day | ORAL | Status: DC
  Administered 2021-02-24 – 2021-02-28 (×5): 100 mg via ORAL
  Filled 2021-02-23 (×8): qty 1

## 2021-02-23 MED ORDER — ACETAMINOPHEN 325 MG PO TABS: 650.0000 mg | ORAL_TABLET | Freq: Four times a day (QID) | ORAL | Status: DC | PRN

## 2021-02-23 MED ORDER — THIAMINE HCL 100 MG/ML IJ SOLN
100.0000 mg | Freq: Once | INTRAMUSCULAR | Status: AC
  Administered 2021-02-23: 100 mg via INTRAMUSCULAR
  Filled 2021-02-23: qty 2

## 2021-02-23 MED ORDER — LORAZEPAM 1 MG PO TABS
1.0000 mg | ORAL_TABLET | Freq: Every day | ORAL | Status: AC
  Administered 2021-02-27: 1 mg via ORAL
  Filled 2021-02-23: qty 1

## 2021-02-23 MED ORDER — ADULT MULTIVITAMIN W/MINERALS CH
1.0000 | ORAL_TABLET | Freq: Every day | ORAL | Status: DC
  Administered 2021-02-23 – 2021-02-28 (×6): 1 via ORAL
  Filled 2021-02-23 (×10): qty 1

## 2021-02-23 MED ORDER — LORAZEPAM 1 MG PO TABS
1.0000 mg | ORAL_TABLET | ORAL | Status: AC | PRN
  Administered 2021-02-26: 1 mg via ORAL
  Filled 2021-02-23: qty 2

## 2021-02-23 MED ORDER — LORAZEPAM 1 MG PO TABS
1.0000 mg | ORAL_TABLET | Freq: Four times a day (QID) | ORAL | Status: AC | PRN
  Administered 2021-02-23 – 2021-02-24 (×2): 1 mg via ORAL
  Filled 2021-02-23: qty 2

## 2021-02-23 MED ORDER — LORAZEPAM 1 MG PO TABS
1.0000 mg | ORAL_TABLET | Freq: Once | ORAL | Status: AC
  Administered 2021-02-23: 1 mg via ORAL
  Filled 2021-02-23: qty 1

## 2021-02-23 MED ORDER — LORAZEPAM 1 MG PO TABS
1.0000 mg | ORAL_TABLET | Freq: Four times a day (QID) | ORAL | Status: AC
  Administered 2021-02-23 – 2021-02-24 (×5): 1 mg via ORAL
  Filled 2021-02-23: qty 1
  Filled 2021-02-23 (×3): qty 2
  Filled 2021-02-23 (×2): qty 1

## 2021-02-23 NOTE — Progress Notes (Signed)
Pt is a 45 y/o Caucasian male admitted to [facility] from [facility] where he presented initially by [employer] under IVC status for etoh intoxication, cocaine use and homelessness. Pt presents disheveled with fair eye contact, depressed mood, A & O X3, very tremulous, unsteady gait, fidgety unable to be still with difficulty concentrating and signing admission document due to tremors. Per pt "I was living in a sober living house for sometime then my buddy said he got an apartment in [place] but we never made it there. I have been living in the woods lately". Reports history of polysubstance abuse to include cocaine and etoh. Per pt "I last used 1 gram of cocaine a week ago but I drank 18 pack of beer before going to the hospital. I've been drinking since I was about 45 years old. Pt states his longest period of sobriety was 7 months about 1.5 year ago. Current stressors are "No money, no job and nowhere to stay right now". Skin assessment done, belonging searched and all items deemed contraband secured in locker. Multiple abrasions noted on pt's arms and legs "that's from sleeping in the woods". Pt ambulatory to unit with a steady gait. Unit orientation done, routines discussed, admission documents signed, care plan reviewed with pt and he verbalized understanding. Q 15 minutes safety checks initiated without self harm gestures / outburst. Fall risk protocol initiated due to pt's unsteady gait. Pt encouraged to voice concerns.

## 2021-02-23 NOTE — BH Assessment (Signed)
Clinician spoke to ED secretary Porfirio Mylar.  She said patient was still resting comfortably from being given Geodon earlier.  Pt was given 20mg  Geodon at 00:32.  TTS to see patient when he is alert and oriented.

## 2021-02-23 NOTE — Tx Team (Signed)
Initial Treatment Plan 02/23/2021 6:56 PM Francisco Houston [id number]    PATIENT STRESSORS: Financial difficulties Substance abuse   PATIENT STRENGTHS: Capable of independent living Communication skills Motivation for treatment/growth Physical Health   PATIENT IDENTIFIED PROBLEMS: Alterations in mood (Anxiety & depression) "I get really anxious and depressed especially when I am coming off alcohol".    Substance abuse (Etoh & Cocaine) "I last drank alcohol 2 nights ago and used cocaine a week ago".    Risk for self harm "I have having thoughts of hurting myself".             DISCHARGE CRITERIA:  Improved stabilization in mood, thinking, and/or behavior Verbal commitment to aftercare and medication compliance  PRELIMINARY DISCHARGE PLAN: Outpatient therapy Placement in alternative living arrangements  PATIENT/FAMILY INVOLVEMENT: This treatment plan has been presented to and reviewed with the patient, Francisco Houston.  The patient have been given the opportunity to ask questions and make suggestions.  Sherryl Manges, RN 02/23/2021, 6:56 PM

## 2021-02-23 NOTE — BH Assessment (Signed)
Secure chat sent to patient's nurse Rosette Reveal, RN) requesting TTS machine to be set up for patient's initial assessment.
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
## 2021-02-23 NOTE — ED Notes (Signed)
[employer] arrived to transport patient to [facility]. [facility] given paperwork including IVC copies. [employer] also given patient's belongings.

## 2021-02-23 NOTE — BH Assessment (Signed)
[facility] Assessment Progress Note  Per Vernard Gambles, NP, this pt requires psychiatric hospitalization.  [name], RN, [facility] has assigned pt to [facility] Rm 304-1 to the service of Landry Mellow, MD.  Pt presents under IVC initiated by a crisis counselor, and upheld by EDP Melene Plan, DO, and IVC documents have been faxed to [facility].  EDP Virgina Norfolk, DO and pt's nurse, Galaxi, have been notified, and Galaxi agrees to call report to [phone].  Pt is to be transported via Patent [employer].   Doylene Canning, [place] [facility] Coordinator [phone]

## 2021-02-23 NOTE — Progress Notes (Signed)
Psychoeducational Group Note  Date:  02/23/2021 Time:  2015  Group Topic/Focus:  wrap up group  Participation Level: Did Not Attend  Participation Quality:  Not Applicable  Affect:  Not Applicable  Cognitive:  Not Applicable  Insight:  Not Applicable  Engagement in Group: Not Applicable  Additional Comments:  Did not attend.   [name] 02/23/2021, 9:00 PM

## 2021-02-23 NOTE — Progress Notes (Signed)
Pt has kept to himself much of the evening.     02/23/21 2300  Psych Admission Type (Psych Patients Only)  Admission Status Involuntary  Psychosocial Assessment  Patient Complaints Anxiety;Agitation  Eye Contact Fair  Facial Expression Anxious;Worried  Affect Appropriate to circumstance  Systems analyst;Soft  Interaction Assertive  Motor Activity Fidgety;Restless;Tremors  Appearance/Hygiene Body odor;Disheveled;In scrubs (Hom[place])  Behavior Characteristics Unwilling to participate  Mood Anxious  Thought Process  Coherency WDL  Content Blaming self  Delusions None reported or observed  Perception WDL  Hallucination None reported or observed  Judgment Poor  Confusion None  Danger to Self  Current suicidal ideation? Denies  Danger to Others  Danger to Others None reported or observed

## 2021-02-24 DIAGNOSIS — F1020 Alcohol dependence, uncomplicated: Secondary | ICD-10-CM | POA: Diagnosis present

## 2021-02-24 DIAGNOSIS — F1410 Cocaine abuse, uncomplicated: Secondary | ICD-10-CM | POA: Diagnosis present

## 2021-02-24 DIAGNOSIS — F332 Major depressive disorder, recurrent severe without psychotic features: Principal | ICD-10-CM

## 2021-02-24 DIAGNOSIS — F1010 Alcohol abuse, uncomplicated: Secondary | ICD-10-CM | POA: Diagnosis present

## 2021-02-24 LAB — LIPID PANEL
Cholesterol: 238 mg/dL — ABNORMAL HIGH (ref 0–200)
HDL: 92 mg/dL (ref >40)
LDL Cholesterol: 122 mg/dL — ABNORMAL HIGH (ref 0–99)
Total CHOL/HDL Ratio: 2.6 RATIO
Triglycerides: 122 mg/dL (ref <150)
VLDL: 24 mg/dL (ref 0–40)

## 2021-02-24 LAB — TSH: TSH: 0.884 u[IU]/mL (ref 0.350–4.500)

## 2021-02-24 LAB — HEMOGLOBIN A1C
Hgb A1c MFr Bld: 5.9 % — ABNORMAL HIGH (ref 4.8–5.6)
Mean Plasma Glucose: 122.63 mg/dL

## 2021-02-24 MED ORDER — MIRTAZAPINE 7.5 MG PO TABS
7.5000 mg | ORAL_TABLET | Freq: Every day | ORAL | Status: DC
  Administered 2021-02-24 – 2021-02-25 (×2): 7.5 mg via ORAL
  Filled 2021-02-24 (×5): qty 1

## 2021-02-24 MED ORDER — CLONIDINE HCL 0.1 MG PO TABS: 0.1000 mg | ORAL_TABLET | Freq: Three times a day (TID) | ORAL | Status: DC | PRN

## 2021-02-24 MED ORDER — GABAPENTIN 100 MG PO CAPS
100.0000 mg | ORAL_CAPSULE | Freq: Three times a day (TID) | ORAL | Status: DC
  Administered 2021-02-24 – 2021-02-26 (×5): 100 mg via ORAL
  Filled 2021-02-24 (×12): qty 1

## 2021-02-24 MED ORDER — PANTOPRAZOLE SODIUM 40 MG PO TBEC
40.0000 mg | DELAYED_RELEASE_TABLET | Freq: Every day | ORAL | Status: DC
  Administered 2021-02-24 – 2021-02-28 (×5): 40 mg via ORAL
  Filled 2021-02-24 (×6): qty 1
  Filled 2021-02-24: qty 7
  Filled 2021-02-24: qty 1
  Filled 2021-02-24: qty 7

## 2021-02-24 MED ORDER — NICOTINE POLACRILEX 2 MG MT GUM
2.0000 mg | CHEWING_GUM | OROMUCOSAL | Status: DC | PRN
  Administered 2021-02-24 – 2021-02-28 (×19): 2 mg via ORAL
  Filled 2021-02-24: qty 1
  Filled 2021-02-24: qty 10
  Filled 2021-02-24 (×3): qty 1

## 2021-02-24 MED ORDER — ACETAMINOPHEN 500 MG PO TABS: 500.0000 mg | ORAL_TABLET | Freq: Three times a day (TID) | ORAL | Status: DC | PRN

## 2021-02-24 NOTE — Progress Notes (Addendum)
   02/24/21 0500  Sleep  Number of Hours 5   Pt woke up with CIWA 14 , Bp 161/118 P 103 sitting and 148/109 P 121 standing . Pt given his PRN dose of Ativan per [facility]

## 2021-02-24 NOTE — BHH Counselor (Signed)
CSW changed pt's address due to pt previously living at [facility] and is now homeless.  Fredirick Lathe, LCSWA Clinicial Social Worker [employer]

## 2021-02-24 NOTE — Progress Notes (Addendum)
[facility] MD Progress Note  02/25/2021 1:21 PM Notnamed Scholz  MRN:  [id number]   Chief Complaint: suicidal ideation and worsening depression in context of alcohol and cocaine abuse  Subjective:  Francisco Houston is a 45 y.o. male who was transferred under IVC from [facility] for worsening depression and suicidal ideation in the context of homelessness and alcohol and cocaine abuse. The patient is currently on Hospital Day 2.   Chart Review from last 24 hours:  The patient's chart was reviewed and nursing notes were reviewed. The patient's case was discussed in multidisciplinary team meeting. Per [facility] he was compliant with scheduled medications and did receive Vistaril X1 for anxiety and an additional Ativan X1 based on CIWA scores at 0644 yesterday morning.  Recent CIWA scores: 14, 7,8, 6  Information Obtained Today During Patient Interview: The patient was seen and evaluated on the unit. On assessment today the patient reports that he slept well overnight and has a fair appetite. He states he feels "a little shaky" but denies other acute signs of withdrawal. He admits to ongoing cravings for alcohol. He denies SI, HI, AVH, paranoia, or delusions. He continues to endorse feeling down and irritable. He was encouraged to shower and get out of bed today to attend groups. He denies medication side-effects.   Principal Problem: MDD (major depressive disorder), recurrent severe, without psychosis (HCC) Diagnosis: Principal Problem:   MDD (major depressive disorder), recurrent severe, without psychosis (HCC) Active Problems:   Cocaine abuse (HCC)   Alcohol abuse  Total Time Spent in Direct Patient Care:  I personally spent 30 minutes on the unit in direct patient care. The direct patient care time included face-to-face time with the patient, reviewing the patient's chart, communicating with other professionals, and coordinating care. Greater than 50% of this time was spent in counseling or coordinating care with  the patient regarding goals of hospitalization, psycho-education, and discharge planning needs.  Past Psychiatric History: see admission H&P  Past Medical History: HTN  Family History: see admission H&P  Family Psychiatric  History: see admission H&P  Social History:  Social History   Substance and Sexual Activity  Alcohol Use Yes   Comment: heavily     Social History   Substance and Sexual Activity  Drug Use Yes  . Types: Cocaine    Social History   Socioeconomic History  . Marital status: Divorced    Spouse name: Not on file  . Number of children: Not on file  . Years of education: Not on file  . Highest education level: Not on file  Occupational History  . Not on file  Tobacco Use  . Smoking status: Current Every Day Smoker  . Smokeless tobacco: Never Used  Substance and Sexual Activity  . Alcohol use: Yes    Comment: heavily  . Drug use: Yes    Types: Cocaine  . Sexual activity: Not on file  Other Topics Concern  . Not on file  Social History Narrative  . Not on file   Social Determinants of Health   Financial Resource Strain: Not on file  Food Insecurity: Not on file  Transportation Needs: Not on file  Physical Activity: Not on file  Stress: Not on file  Social Connections: Not on file   Sleep: Good  Appetite:  Fair  Current Medications: Current Facility-Administered Medications  Medication Dose Route Frequency Provider Last Rate Last Admin  . acetaminophen (TYLENOL) tablet 500 mg  500 mg Oral Q8H PRN Comer Locket, MD      .  alum & mag hydroxide-simeth (MAALOX/MYLANTA) 200-200-20 MG/5ML suspension 30 mL  30 mL Oral Q4H PRN Vernard Gamblesoleman, Carolyn H, NP      . cloNIDine (CATAPRES) tablet 0.1 mg  0.1 mg Oral Q8H PRN Mason JimSingleton, Kuper Rennels E, MD      . gabapentin (NEURONTIN) capsule 100 mg  100 mg Oral TID Mason JimSingleton, Ruhani Umland E, MD   100 mg at 02/25/21 1250  . hydrOXYzine (ATARAX/VISTARIL) tablet 25 mg  25 mg Oral Q6H PRN Ardis Hughsoleman, Carolyn H, NP   25 mg at 02/24/21  2119  . loperamide (IMODIUM) capsule 2-4 mg  2-4 mg Oral PRN Ardis Hughsoleman, Carolyn H, NP      . LORazepam (ATIVAN) tablet 1 mg  1 mg Oral Q6H PRN Ardis Hughsoleman, Carolyn H, NP   1 mg at 02/24/21 0644  . LORazepam (ATIVAN) tablet 1 mg  1 mg Oral TID Ardis Hughsoleman, Carolyn H, NP   1 mg at 02/25/21 1250   Followed by  . [START ON 02/26/2021] LORazepam (ATIVAN) tablet 1 mg  1 mg Oral BID Ardis Hughsoleman, Carolyn H, NP       Followed by  . [START ON 02/27/2021] LORazepam (ATIVAN) tablet 1 mg  1 mg Oral Daily Vernard Gamblesoleman, Carolyn H, NP      . OLANZapine zydis (ZYPREXA) disintegrating tablet 5 mg  5 mg Oral Q8H PRN Ardis Hughsoleman, Carolyn H, NP   5 mg at 02/23/21 1541   And  . LORazepam (ATIVAN) tablet 1 mg  1 mg Oral PRN Ardis Hughsoleman, Carolyn H, NP       And  . ziprasidone (GEODON) injection 20 mg  20 mg Intramuscular PRN Ardis Hughsoleman, Carolyn H, NP      . magnesium hydroxide (MILK OF MAGNESIA) suspension 30 mL  30 mL Oral Daily PRN Ardis Hughsoleman, Carolyn H, NP      . mirtazapine (REMERON) tablet 7.5 mg  7.5 mg Oral QHS Mason JimSingleton, Zaelyn Noack E, MD   7.5 mg at 02/24/21 2119  . multivitamin with minerals tablet 1 tablet  1 tablet Oral Daily Ardis Hughsoleman, Carolyn H, NP   1 tablet at 02/25/21 0801  . nicotine polacrilex (NICORETTE) gum 2 mg  2 mg Oral PRN Comer LocketSingleton, Cori Henningsen E, MD   2 mg at 02/25/21 1251  . ondansetron (ZOFRAN-ODT) disintegrating tablet 4 mg  4 mg Oral Q6H PRN Ardis Hughsoleman, Carolyn H, NP      . pantoprazole (PROTONIX) EC tablet 40 mg  40 mg Oral Daily Bartholomew CrewsSingleton, Kendricks Reap E, MD   40 mg at 02/25/21 0801  . thiamine tablet 100 mg  100 mg Oral Daily Ardis Hughsoleman, Carolyn H, NP   100 mg at 02/25/21 0801  . traZODone (DESYREL) tablet 50 mg  50 mg Oral QHS PRN Ardis Hughsoleman, Carolyn H, NP        Lab Results:  Results for orders placed or performed during the hospital encounter of 02/23/21 (from the past 48 hour(s))  Hemoglobin A1c     Status: Abnormal   Collection Time: 02/24/21  6:40 AM  Result Value Ref Range   Hgb A1c MFr Bld 5.9 (H) 4.8 - 5.6 %    Comment: (NOTE) Pre  diabetes:          5.7%-6.4%  Diabetes:              >6.4%  Glycemic control for   <7.0% adults with diabetes    Mean Plasma Glucose 122.63 mg/dL    Comment: Performed at [facility], [address] [place], [place] [address]  Lipid panel  Status: Abnormal   Collection Time: 02/24/21  6:40 AM  Result Value Ref Range   Cholesterol 238 (H) 0 - 200 mg/dL   Triglycerides 962 <229 mg/dL   HDL 92 >79 mg/dL   Total CHOL/HDL Ratio 2.6 RATIO   VLDL 24 0 - 40 mg/dL   LDL Cholesterol 892 (H) 0 - 99 mg/dL    Comment:        Total Cholesterol/HDL:CHD Risk Coronary Heart Disease Risk Table                     Men   Women  1/2 Average Risk   3.4   3.3  Average Risk       5.0   4.4  2 X Average Risk   9.6   7.1  3 X Average Risk  23.4   11.0        Use the calculated Patient Ratio above and the CHD Risk Table to determine the patient's CHD Risk.        ATP III CLASSIFICATION (LDL):  <100     mg/dL   Optimal  119-417  mg/dL   Near or Above                    Optimal  130-159  mg/dL   Borderline  408-144  mg/dL   High  >818     mg/dL   Very High Performed at [facility], [address] [place], [place] [address]   TSH     Status: None   Collection Time: 02/24/21  6:40 AM  Result Value Ref Range   TSH 0.884 0.350 - 4.500 uIU/mL    Comment: Performed by a 3rd Generation assay with a functional sensitivity of <=0.01 uIU/mL. Performed at [facility], [address], [place], [place] [address]   Protime-INR     Status: None   Collection Time: 02/25/21  6:41 AM  Result Value Ref Range   Prothrombin Time 12.4 11.4 - 15.2 seconds   INR 0.9 0.8 - 1.2    Comment: (NOTE) INR goal varies based on device and disease states. Performed at [facility], [address], [place], [place] [address]   Hepatic function panel     Status: Abnormal   Collection Time: 02/25/21  6:41 AM  Result Value Ref Range   Total Protein 8.0  6.5 - 8.1 g/dL   Albumin 3.9 3.5 - 5.0 g/dL   AST 53 (H) 15 - 41 U/L   ALT 53 (H) 0 - 44 U/L   Alkaline Phosphatase 72 38 - 126 U/L   Total Bilirubin 0.9 0.3 - 1.2 mg/dL   Bilirubin, Direct 0.2 0.0 - 0.2 mg/dL   Indirect Bilirubin 0.7 0.3 - 0.9 mg/dL    Comment: Performed at [facility], [address] [place], [place] [address]   Blood Alcohol level:  Lab Results  Component Value Date   ETH 325 [facility]) 02/22/2021   ETH 370 (HH) 02/20/2021   Metabolic Disorder Labs: Lab Results  Component Value Date   HGBA1C 5.9 (H) 02/24/2021   MPG 122.63 02/24/2021   No results found for: PROLACTIN Lab Results  Component Value Date   CHOL 238 (H) 02/24/2021   TRIG 122 02/24/2021   HDL 92 02/24/2021   CHOLHDL 2.6 02/24/2021   VLDL 24 02/24/2021   LDLCALC 122 (H) 02/24/2021   Physical Findings: AIMS: Facial and Oral Movements Muscles of  Facial Expression: None, normal Lips and Perioral Area: None, normal Jaw: None, normal Tongue: None, normal,Extremity Movements Upper (arms, wrists, hands, fingers): None, normal Lower (legs, knees, ankles, toes): None, normal, Trunk Movements Neck, shoulders, hips: None, normal, Overall Severity Severity of abnormal movements (highest score from questions above): None, normal Incapacitation due to abnormal movements: None, normal Patient's awareness of abnormal movements (rate only patient's report): No Awareness, Dental Status Current problems with teeth and/or dentures?: Yes Does patient usually wear dentures?: No  CIWA:  CIWA-Ar Total: 10    Musculoskeletal: Strength & Muscle Tone: within normal limits Gait & Station: normal, steady Patient leans: N/A  Psychiatric Specialty Exam: Physical Exam Vitals reviewed.  HENT:     Head: Normocephalic.  Pulmonary:     Effort: Pulmonary effort is normal.  Neurological:     Mental Status: He is alert.     Review of Systems  Respiratory: Negative for shortness of breath.    Cardiovascular: Negative for chest pain.  Gastrointestinal: Negative for diarrhea, nausea and vomiting.    Blood pressure (!) 115/92, pulse (!) 113, temperature (!) 97.5 F (36.4 C), temperature source Oral, resp. rate 16, height 5\' 6"  (1.676 m), weight 77.1 kg, SpO2 98 %.Body mass index is 27.44 kg/m.  General Appearance: in scrubs, disheveled appearing  Eye Contact:  Minimal  Speech:  Clear and Coherent and Normal Rate  Volume:  Normal  Mood:  Irritable  Affect:  Congruent  Thought Process:  Goal Directed and Linear  Orientation:  Full (Time, Place, and Person)  Thought Content:  Logical and no evidence of delusions, paranoia, or psychosis on exam  Suicidal Thoughts:  No  Homicidal Thoughts:  No  Memory:  Recent;   Fair  Judgement:  Fair  Insight:  Fair  Psychomotor Activity:  Normal  Concentration:  Concentration: Fair and Attention Span: Fair  Recall:  of Knowledge:  Fair  Language:  Good  Akathisia:  Negative  Assets:  Communication Skills Desire for Improvement Resilience  ADL's:  Intact  Cognition:  WNL  Sleep:  Number of Hours: 6.75   Treatment Plan Summary: Diagnoses / Active Problems: MDD recurrent severe without psychotic features (r/o substance induced depressive d/o) Alcohol use d/o Stimulant use d/o - cocaine type Tobacco use d/o  PLAN: 1. Safety and Monitoring:             -- Involuntary admission to inpatient psychiatric unit for safety, stabilization and treatment             -- Daily contact with patient to assess and evaluate symptoms and progress in treatment             -- Patient's case to be discussed in multi-disciplinary team meeting             -- Observation Level : q15 minute checks             -- Vital signs:  q12 hours             -- Precautions: suicide  2. Psychiatric Diagnoses and Treatment:              MDD recurrent severe without psychotic features (r/o substance induced depressive d/o)             -- Continue  Remeron 7.5mg  qhs to help with appetite, sleep, and mood - titrating up as tolerated             -- Continue Neurontin 100mg  tid to  help with mood and potential withdrawal              -- Continue Vistaril  q6 hours PRN anxiety             -- Continue Trazodone  po qhs PRN insomnia             -- Encouraged patient to participate in unit milieu and in scheduled group therapies              -- Short Term Goals: Ability to identify changes in lifestyle to reduce recurrence of condition will improve, Ability to demonstrate self-control will improve and Ability to identify and develop effective coping behaviors will improve             -- Long Term Goals: Improvement in symptoms so as ready for discharge              Alcohol use d/o             Stimulant use d/o - cocaine type             -- Continue CIWA protocol with scheduled Ativan taper for alcohol withdrawal             -- Oral thiamine and MVI replacement             -- Continue Protonix  daily for GI protection             -- Patient interested in residential rehab options after discharge- discussed option of [facility] or [facility] in the future             -- Short Term Goals: Ability to identify triggers associated with substance abuse/mental health issues will improve             -- Long Term Goals: Improvement in symptoms so as ready for discharge              3. Medical Issues Being Addressed:              Tobacco Use Disorder             -- Nicorette gum ordered PRN             -- Smoking cessation encouraged              HTN             -- will monitor BP and restart Norvasc  daily as needed             -- Clonidine 0.1mg  q8 hours PRN SBP >160 or DBP >110               Hyperlipidemia             -- Cholesterol 238 with LDL 122 - will need to see PCP for monitoring after discharge              Abnormal EKG with tachycardia and age indeterminate septal infarct             -- patient currently asymptomatic  without CP or SOB             -- Repeat EKG 02/24/21 shows NSR 85bpm QTc and no signs of septal infarct or acute ischemia              Elevated LFTs             -- Repeat Hepatic function panel shows AST 53 down  from 64 and ALT 53 down from 58; PT 12.4 and INR 0.9; and hepatitis panel nonreactive             -- Limiting Tylenol  4. Discharge Planning:              -- Social work and case management to assist with discharge planning and identification of hospital follow-up needs prior to discharge             -- Estimated LOS: 5-7 days             -- Discharge Concerns: Need to establish a safety plan; Medication compliance and effectiveness             -- Discharge Goals: Return home with outpatient referrals for mental health follow-up including medication management/psychotherapy  I certify that inpatient services furnished can reasonably be expected to improve the patient's condition.    Comer Locket, MD, FAPA 02/25/2021, 1:21 PM
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
## 2021-02-24 NOTE — Progress Notes (Signed)
Progress note    02/24/21 0742  Psych Admission Type (Psych Patients Only)  Admission Status Involuntary  Psychosocial Assessment  Patient Complaints Anxiety;Depression;Worrying  Eye Contact Fair  Facial Expression Anxious;Pensive;Worried  Affect Anxious;Sad;Sullen  Insurance account manager Cooperative;Appropriate to situation;Anxious  Mood Depressed;Anxious;Sad;Sullen;Pleasant  Thought Administrator, sports thinking  Content Blaming self  Delusions None reported or observed  Perception WDL  Hallucination None reported or observed  Judgment Poor  Confusion None  Danger to Self  Current suicidal ideation? Denies  Danger to Others  Danger to Others None reported or observed

## 2021-02-24 NOTE — BHH Counselor (Signed)
Adult Comprehensive Assessment  Patient ID: Francisco Houston, male   DOB: [birth year], 45 y.o.   MRN: [id number]  Information Source: Information source: Patient  Current Stressors:  Patient states their primary concerns and needs for treatment are:: Patient states that he was feeling hopeless about life and was going to attempt suicide by shooting himself before calling 911. Patient states their goals for this hospitilization and ongoing recovery are:: Patient would like to get life back together.  Patient states thtat he needs housing and to get stabilized. Educational / Learning stressors: Patient reports that he has some college but currently not in school Employment / Job issues: Patient has lossed several jobs due to ongoing alcohol use. Family Relationships: Patient reports being estranged from most of his family members- including parents, ex wife and young daughter Surveyor, quantity / Lack of resources (include bankruptcy): Patient reports that he lossed all his money due to alcohol and "feeling bad for himself." Housing / Lack of housing: currently living in the woods Physical health (include injuries & life threatening diseases): none reported Social relationships: Patient identified 2 friends that he has been friends with for 30 years, including friend from [place], [place] Substance abuse: alcohol, cocaine Bereavement / Loss: loss of sister  Living/Environment/Situation:  Living Arrangements: Other (Comment) (homeless) Living conditions (as described by patient or guardian): currently living in the woods Who else lives in the home?: n/a How long has patient lived in current situation?: past 1/2 years What is atmosphere in current home: Chaotic  Family History:  Marital status: Divorced Divorced, when?: 2019 What types of issues is patient dealing with in the relationship?: Patient divorced wife and has no contact with daughter,  patient has had a girlfriend but does not have  trusting relationship Additional relationship information: n/a Are you sexually active?:  (did not discuss) What is your sexual orientation?: did not discuss Has your sexual activity been affected by drugs, alcohol, medication, or emotional stress?: n/a Does patient have children?: Yes How many children?: 1 How is patient's relationship with their children?: estranged  Childhood History:     Education:  Highest grade of school patient has completed: some college Currently a student?: No Learning disability?: No  Employment/Work Situation:   Employment situation: Unemployed Patient's job has been impacted by current illness: Yes Describe how patient's job has been impacted: patient has not been able to keep a job for a long period of time What is the longest time patient has a held a job?: prior to 2019 patient reports a stable job with good financial resources for many years Where was the patient employed at that time?: patient working in Airline pilot Has patient ever been in the [employer]?: No  Financial Resources:   Financial resources: Income from employment Does patient have a representative payee or guardian?: No  Alcohol/Substance Abuse:   What has been your use of drugs/alcohol within the last 12 months?: alcohol and cocaine If attempted suicide, did drugs/alcohol play a role in this?: Yes Alcohol/Substance Abuse Treatment Hx: Past detox,Relapse prevention program,Past Tx, Inpatient,Past Tx, Outpatient If yes, describe treatment: Patient has been to some inpatient detox and residential programs in [place], [place], as well as sober living housing, [facility] in [place] Has alcohol/substance abuse ever caused legal problems?: Yes (DWI)  Social Support System:   Patient's Community Support System: Poor Describe Community Support System: Patient has contact with good friend, Francisco Houston, from [place], patient occassionally will contact ex wife- Type of faith/religion: did not want  to discuss  How does patient's faith help to cope with current illness?: patient's main motivation is his daugheter, 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  Leisure/Recreation:   Do You Have Hobbies?: Yes Leisure and Hobbies: patient reports that when he is not stressed about his living situation, he used to enjoy playing golf.  Strengths/Needs:   What is the patient's perception of their strengths?: resilient, wanting to be there for his daughter Patient states they can use these personal strengths during their treatment to contribute to their recovery: motivation to be better for his daughter Patient states these barriers may affect/interfere with their treatment: alcohol use Patient states these barriers may affect their return to the community: lack of stability with housing. Other important information patient would like considered in planning for their treatment: Patient reports that he knows that if he doesn't have a stable place to stay or a plan he knows that he will smoke a cigarette, drink and end up with withdrawals and depression and back where he feels hopeless and ready to end his life  Discharge Plan:   Currently receiving community mental health services: No Patient states concerns and preferences for aftercare planning are: none reported Patient states they will know when they are safe and ready for discharge when: when he can have a plan Does patient have access to transportation?: No Does patient have financial barriers related to discharge medications?: Yes Patient description of barriers related to discharge medications: no insurance or income Plan for no access to transportation at discharge: safe transport Plan for living situation after discharge: none reported Will patient be returning to same living situation after discharge?: No  Summary/Recommendations:   Summary and Recommendations (to be completed by the evaluator): While here, Francisco Houston can benefit from  crisis stabilization, medication management, therapeutic milieu, and referrals for services  Francisco Houston Francisco Houston. 02/24/2021

## 2021-02-24 NOTE — Tx Team (Signed)
Interdisciplinary Treatment and Diagnostic Plan Update  02/24/2021 Time of Session: Sincere Liuzzi MRN: [id number]  Principal Diagnosis: <principal problem not specified>  Secondary Diagnoses: Active Problems:   MDD (major depressive disorder), recurrent, severe, with psychosis ([place])   Current Medications:  Current Facility-Administered Medications  Medication Dose Route Frequency Provider Last Rate Last Admin  . acetaminophen (TYLENOL) tablet 650 mg  650 mg Oral Q6H PRN Revonda Humphrey, NP      . alum & mag hydroxide-simeth (MAALOX/MYLANTA) 200-200-20 MG/5ML suspension 30 mL  30 mL Oral Q4H PRN Revonda Humphrey, NP      . hydrOXYzine (ATARAX/VISTARIL) tablet 25 mg  25 mg Oral Q6H PRN Revonda Humphrey, NP      . loperamide (IMODIUM) capsule 2-4 mg  2-4 mg Oral PRN Revonda Humphrey, NP      . LORazepam (ATIVAN) tablet 1 mg  1 mg Oral Q6H PRN Revonda Humphrey, NP   1 mg at 02/24/21 0644  . LORazepam (ATIVAN) tablet 1 mg  1 mg Oral QID Revonda Humphrey, NP   1 mg at 02/24/21 1246   Followed by  . [START ON 02/25/2021] LORazepam (ATIVAN) tablet 1 mg  1 mg Oral TID Revonda Humphrey, NP       Followed by  . [START ON 02/26/2021] LORazepam (ATIVAN) tablet 1 mg  1 mg Oral BID Revonda Humphrey, NP       Followed by  . [START ON 02/27/2021] LORazepam (ATIVAN) tablet 1 mg  1 mg Oral Daily Thomes Lolling H, NP      . OLANZapine zydis (ZYPREXA) disintegrating tablet 5 mg  5 mg Oral Q8H PRN Revonda Humphrey, NP   5 mg at 02/23/21 1541   And  . LORazepam (ATIVAN) tablet 1 mg  1 mg Oral PRN Revonda Humphrey, NP       And  . ziprasidone (GEODON) injection 20 mg  20 mg Intramuscular PRN Revonda Humphrey, NP      . magnesium hydroxide (MILK OF MAGNESIA) suspension 30 mL  30 mL Oral Daily PRN Revonda Humphrey, NP      . multivitamin with minerals tablet 1 tablet  1 tablet Oral Daily Revonda Humphrey, NP   1 tablet at 02/24/21 [phone]  . ondansetron (ZOFRAN-ODT) disintegrating tablet  4 mg  4 mg Oral Q6H PRN Revonda Humphrey, NP      . thiamine tablet 100 mg  100 mg Oral Daily Revonda Humphrey, NP   100 mg at 02/24/21 0742  . traZODone (DESYREL) tablet 50 mg  50 mg Oral QHS PRN Revonda Humphrey, NP       PTA Medications: No medications prior to admission.    Patient Stressors: Financial difficulties Substance abuse  Patient Strengths: Capable of independent living Agricultural engineer for treatment/growth Physical Health  Treatment Modalities: Medication Management, Group therapy, Case management,  1 to 1 session with clinician, Psychoeducation, Recreational therapy.   Physician Treatment Plan for Primary Diagnosis: <principal problem not specified> Long Term Goal(s):     Short Term Goals:    Medication Management: Evaluate patient's response, side effects, and tolerance of medication regimen.  Therapeutic Interventions: 1 to 1 sessions, Unit Group sessions and Medication administration.  Evaluation of Outcomes: Not Met  Physician Treatment Plan for Secondary Diagnosis: Active Problems:   MDD (major depressive disorder), recurrent, severe, with psychosis ([place])  Long Term Goal(s):     Short Term Goals:  Medication Management: Evaluate patient's response, side effects, and tolerance of medication regimen.  Therapeutic Interventions: 1 to 1 sessions, Unit Group sessions and Medication administration.  Evaluation of Outcomes: Not Met   RN Treatment Plan for Primary Diagnosis: <principal problem not specified> Long Term Goal(s): Knowledge of disease and therapeutic regimen to maintain health will improve  Short Term Goals: Ability to participate in decision making will improve, Ability to identify and develop effective coping behaviors will improve and Compliance with prescribed medications will improve  Medication Management: RN will administer medications as ordered by provider, will assess and evaluate patient's response and  provide education to patient for prescribed medication. RN will report any adverse and/or side effects to prescribing provider.  Therapeutic Interventions: 1 on 1 counseling sessions, Psychoeducation, Medication administration, Evaluate responses to treatment, Monitor vital signs and CBGs as ordered, Perform/monitor CIWA, COWS, AIMS and Fall Risk screenings as ordered, Perform wound care treatments as ordered.  Evaluation of Outcomes: Not Met   LCSW Treatment Plan for Primary Diagnosis: <principal problem not specified> Long Term Goal(s): Safe transition to appropriate next level of care at discharge, Engage patient in therapeutic group addressing interpersonal concerns.  Short Term Goals: Engage patient in aftercare planning with referrals and resources, Increase social support and Increase ability to appropriately verbalize feelings  Therapeutic Interventions: Assess for all discharge needs, 1 to 1 time with Social worker, Explore available resources and support systems, Assess for adequacy in community support network, Educate family and significant other(s) on suicide prevention, Complete Psychosocial Assessment, Interpersonal group therapy.  Evaluation of Outcomes: Not Met   Progress in Treatment: Attending groups: No. Participating in groups: No. Taking medication as prescribed: Yes. Toleration medication: Yes. Family/Significant other contact made: No, will contact:  CSW will obtain consents from pt Patient understands diagnosis: Yes. and No. Discussing patient identified problems/goals with staff: No. Medical problems stabilized or resolved: Yes. Denies suicidal/homicidal ideation: Yes. Issues/concerns per patient self-inventory: No. Other: None  New problem(s) identified: No, Describe:  None  New Short Term/Long Term Goal(s):medication stabilization, elimination of SI thoughts, development of comprehensive mental wellness plan.  Patient Goals:  Unable to attend  Discharge  Plan or Barriers: Patient recently admitted. CSW will continue to follow and assess for appropriate referrals and possible discharge planning.  Reason for Continuation of Hospitalization: Medication stabilization Suicidal ideation Withdrawal symptoms  Estimated Length of Stay: 3-5 days  Attendees: Patient: 02/24/2021   Physician:  02/24/2021   Nursing:  02/24/2021   RN Care Manager: 02/24/2021   Social Worker: Toney Reil, Latanya Presser 02/24/2021   Recreational Therapist:  02/24/2021   Other:  02/24/2021   Other:  02/24/2021   Other: 02/24/2021       Scribe for Treatment Team: Mliss Fritz, Latanya Presser 02/24/2021 1:52 PM

## 2021-02-24 NOTE — Progress Notes (Signed)
Recreation Therapy Notes  Date:  4.29.22 Time: 0935 Location: 300 Hall Dayroom  Group Topic: Stress Management  Goal Area(s) Addresses:  Patient will identify positive stress management techniques. Patient will identify benefits of using stress management post d/c.  Intervention: Stress Management  Activity:  Meditation.  LRT played a meditation that focused on calming anxiety.  Patients were to listen as the meditation played and focus on their breathing, releasing any tension and what they are feeling.    Education:  Stress Management, Discharge Planning.   Education Outcome: Acknowledges Education  Clinical Observations/Feedback: Pt did not attend group session.    Adalis Gatti, LRT/CTRS    Natia Fahmy A 02/24/2021 11:31 AM 

## 2021-02-24 NOTE — BHH Group Notes (Signed)
Type of Therapy and Topic: Group Therapy: Anger Management   Participation: Active  Description of Group: In this group, patients will learn helpful strategies and techniques to manage anger, express anger in alternative ways, change hostile attitudes, and prevent aggressive acts, such as verbal abuse and violence.This group will be process-oriented and eductional, with patients participating in exploration of their own experiences as well as giving and receiving support and challenge from other group members.  Therapeutic Goals: 1. Patient will learn to manage anger. 2. Patient will learn to stop violence or the threat of violence. 3. Patient will learn to develop self control over thoughts and actions. 4. Patient will receive support and feedback from others  CSW provided worksheet packets for group members and answered any questions that were asked during this time.   Therapeutic Modalities: Cognitive Behavioral Therapy Solution Focused Therapy Motivational Interviewing   Casin Federici, LCSW, LCAS Clincal Social Worker  [facility]   
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
## 2021-02-24 NOTE — Plan of Care (Signed)
  Problem: Education: Goal: Knowledge of [facility] Education information/materials will improve Outcome: Progressing Goal: Emotional status will improve Outcome: Progressing Goal: Mental status will improve Outcome: Progressing Goal: Verbalization of understanding the information provided will improve Outcome: Progressing   

## 2021-02-25 LAB — PROTIME-INR
INR: 0.9 (ref 0.8–1.2)
Prothrombin Time: 12.4 seconds (ref 11.4–15.2)

## 2021-02-25 LAB — HEPATIC FUNCTION PANEL
ALT: 53 U/L — ABNORMAL HIGH (ref 0–44)
AST: 53 U/L — ABNORMAL HIGH (ref 15–41)
Albumin: 3.9 g/dL (ref 3.5–5.0)
Alkaline Phosphatase: 72 U/L (ref 38–126)
Bilirubin, Direct: 0.2 mg/dL (ref 0.0–0.2)
Indirect Bilirubin: 0.7 mg/dL (ref 0.3–0.9)
Total Bilirubin: 0.9 mg/dL (ref 0.3–1.2)
Total Protein: 8 g/dL (ref 6.5–8.1)

## 2021-02-25 NOTE — [facility] Group Notes (Signed)
Psychoeducational Group Note    Date:02/25/2021 Time: 1300-1400    Life Skills:  A group where two lists are made. What people need and what are things that we do that are healthy. The lists are developed by the patients and it is explained that we often do the actions that are not healthy to get our list of needs met.   Purpose of Group: . The group focus' on teaching patients on how to identify their needs and how to develop the coping skills needed to get their needs met  Participation Level:  Active  Participation Quality:  Appropriate  Affect:  Appropriate  Cognitive:  Oriented  Insight:  Improving  Engagement in Group:  Engaged  Additional Comments: Pt rates his energy at a 4/10. Partisiapated in the group forming the lists and was able to say at the end that he has learned how not to allow others to manipulate him.  Paulino Rily

## 2021-02-25 NOTE — Progress Notes (Signed)
   02/25/21 0630  Vital Signs  Pulse Rate (!) 113  Resp 16  BP (!) 115/92  BP Location Left Arm  BP Method Automatic  Patient Position (if appropriate) Standing   D: Patient denies SI/HI/AVH. Patient rated anxiety 10/10 and depression 8/10. Pt. Stated, "nothing good today" A:  Patient took scheduled medicine.  Pt's cravings for nicotine relieved by nicorette gum. Support and encouragement provided Routine safety checks conducted every 15 minutes. Patient  Informed to notify staff with any concerns.   R: Safety maintained.

## 2021-02-25 NOTE — Progress Notes (Signed)
[facility] Group Notes:  (Nursing/MHT/Case Management/Adjunct)  Date:  02/25/2021  Time:  2030  Type of Therapy:  wrap up group  Participation Level:  Active  Participation Quality:  Appropriate, Attentive, Sharing and Supportive  Affect:  Depressed and Irritable  Cognitive:  Alert  Insight:  Improving  Engagement in Group:  Engaged  Modes of Intervention:  Clarification, Education and Support  Summary of Progress/Problems: Positive thinking and self-care were discussed.   Marcille Buffy 02/25/2021, 10:33 PM

## 2021-02-25 NOTE — Progress Notes (Signed)
   02/25/21 0630  Vital Signs  Pulse Rate (!) 113  Resp 16  BP (!) 115/92  BP Location Left Arm  BP Method Automatic  Patient Position (if appropriate) Standing   D: Patient denies Si\I/HI/AVH. Patient rated anxiety 10/10 and depression 9/10. Pt. Stated "I would like to discharge" Pt. Isolated in room.  A:  Patient took scheduled medicine.  Support and encouragement provided Routine safety checks conducted every 15 minutes. Patient  Informed to notify staff with any concerns.   R: Safety maintained.

## 2021-02-25 NOTE — [facility] Suicide Risk Assessment (Signed)
[facility] INPATIENT:  Family/Significant Other Suicide Prevention Education  Suicide Prevention Education:  Contact Attempts:  Ruthy Dick (friend) [phone] , (name of family member/significant other) has been identified by the patient as the family member/significant other with whom the patient will be residing, and identified as the person(s) who will aid the patient in the event of a mental health crisis.  With written consent from the patient, two attempts were made to provide suicide prevention education, prior to and/or following the patient's discharge.  We were unsuccessful in providing suicide prevention education.  A suicide education pamphlet was given to the patient to share with family/significant other.  Date and time of first attempt:  02/25/2021   /   4:45pm Date and time of second attempt:   To be done by CSW team prior to discharge  Lynnell Chad 02/25/2021, 4:46 PM

## 2021-02-25 NOTE — Progress Notes (Signed)
Pt visible on the unit pt stated he was feeling better. Pt got  PRN Trazodone per MAR with HS medication    02/25/21 0000  Psych Admission Type (Psych Patients Only)  Admission Status Involuntary  Psychosocial Assessment  Patient Complaints Anxiety  Eye Contact Fair  Facial Expression Anxious;Pensive;Worried  Affect Anxious;Sad;Sullen  Insurance account manager Cooperative;Anxious  Mood Anxious;Depressed  Thought Process  Coherency Concrete thinking  Content Blaming self  Delusions None reported or observed  Perception WDL  Hallucination None reported or observed  Judgment Poor  Confusion None  Danger to Self  Current suicidal ideation? Denies  Danger to Others  Danger to Others None reported or observed

## 2021-02-25 NOTE — Progress Notes (Signed)
   02/25/21 0500  Sleep  Number of Hours 6.75

## 2021-02-25 NOTE — Progress Notes (Signed)
Pt visible on the unit this evening. Pt stated he was feeling better. Pt given PRN Vistaril and Trazodone per MAR with HS medication    02/25/21 2200  Psych Admission Type (Psych Patients Only)  Admission Status Involuntary  Psychosocial Assessment  Patient Complaints Anxiety;Depression  Eye Contact Fair  Facial Expression Anxious;Pensive;Worried  Affect Anxious;Sad;Sullen  Insurance account manager Cooperative;Anxious  Mood Depressed;Anxious  Thought Process  Coherency Concrete thinking  Content Blaming self  Delusions None reported or observed  Perception WDL  Hallucination None reported or observed  Judgment Poor  Confusion None  Danger to Self  Current suicidal ideation? Denies  Danger to Others  Danger to Others None reported or observed

## 2021-02-25 NOTE — [facility] Group Notes (Signed)
.  Psychoeducational Group Note  Date: 02/25/2021 Time: 0900-1000    Goal Setting   Purpose of Group: This group helps to provide patients with the steps of setting a goal that is specific, measurable, attainable, realistic and time specific. A discussion on how we keep ourselves stuck with negative self talk.    Participation Level:  Did not attend   Harjit Douds A  

## 2021-02-26 LAB — HEPATITIS PANEL, ACUTE
HCV Ab: NONREACTIVE
Hep A IgM: NONREACTIVE
Hep B C IgM: NONREACTIVE
Hepatitis B Surface Ag: NONREACTIVE

## 2021-02-26 MED ORDER — MIRTAZAPINE 15 MG PO TABS
15.0000 mg | ORAL_TABLET | Freq: Every day | ORAL | Status: DC
  Administered 2021-02-26 – 2021-02-27 (×2): 15 mg via ORAL
  Filled 2021-02-26 (×2): qty 1
  Filled 2021-02-26: qty 7
  Filled 2021-02-26: qty 1
  Filled 2021-02-26: qty 7

## 2021-02-26 MED ORDER — GABAPENTIN 100 MG PO CAPS
200.0000 mg | ORAL_CAPSULE | Freq: Three times a day (TID) | ORAL | Status: DC
  Administered 2021-02-26 – 2021-02-28 (×7): 200 mg via ORAL
  Filled 2021-02-26 (×2): qty 2
  Filled 2021-02-26: qty 42
  Filled 2021-02-26: qty 2
  Filled 2021-02-26 (×2): qty 42
  Filled 2021-02-26: qty 2
  Filled 2021-02-26: qty 42
  Filled 2021-02-26 (×4): qty 2
  Filled 2021-02-26: qty 42
  Filled 2021-02-26 (×2): qty 2
  Filled 2021-02-26: qty 42
  Filled 2021-02-26: qty 2

## 2021-02-26 NOTE — [facility] Group Notes (Signed)
Adult Psychoeducational Group Not Date:  02/26/2021 Time:  0900-1045 Group Topic/Focus: PROGRESSIVE RELAXATION. A group where deep breathing is taught and tensing and relaxation muscle groups is used. Imagery is used as well.  Pts are asked to imagine 3 pillars that hold them up when they are not able to hold themselves up.  Participation Level:  Active  Participation Quality:  Appropriate  Affect:  Appropriate  Cognitive:  Oriented  Insight: Improving  Engagement in Group:  Engaged  Modes of Intervention:  Activity, Discussion, Education, and Support  Additional Comments:  Rates his energy at a 2. States his self respect holds him up and really nothing else  Dione Housekeeper

## 2021-02-26 NOTE — Progress Notes (Signed)
   02/26/21 0500  Sleep  Number of Hours 5.5

## 2021-02-26 NOTE — [facility] Suicide Risk Assessment (Signed)
[facility] INPATIENT:  Family/Significant Other Suicide Prevention Education  Suicide Prevention Education:  Education Completed;  Francisco Houston (friend) [phone] ,  (name of family member/significant other) has been identified by the patient as the family member/significant other with whom the patient will be residing, and identified as the person(s) who will aid the patient in the event of a mental health crisis (suicidal ideations/suicide attempt).  Friend has known patient for 20+ years, states he is self-medicating his mental health issues and the trauma from his family.  Friend and his brother will give him food, rides different places, and pay for his storage unit, but will not pay for things like rooms because he just abuses them.  They will not allow him to live with them because he continues to refuse to make changes.  The patient apparently got a tax refund check of $1200 several weeks ago, and ignored advice about obtaining housing with it, instead spent it on cocaine and prostitutes.  He has anger issues, always has, and always blames someone else for his problems, i.e his sister's death, his parents, his friends, and such.  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  Friend has never known patient to be suicidal previously, but he did recently talk about wanting to jump in front of a car.  CSW had to be very frank in letting friend know that patient is at risk of suicide, despite that being something he has always said he would not do.  CSW provided Mobile Crisis number.  This friend lives in [place] so is not on-site to help.    With written consent from the patient, the family member/significant other has been provided the following suicide prevention education, prior to the and/or following the discharge of the patient.  The suicide prevention education provided includes the following:  Suicide risk factors  Suicide prevention and interventions  National Suicide  Hotline telephone number  [facility] assessment telephone number  [facility] Emergency Assistance 911  [facility] and/or Residential Mobile Crisis Unit telephone number  Request made of family/significant other to:  Remove weapons (e.g., guns, rifles, knives), all items previously/currently identified as safety concern.    Remove drugs/medications (over-the-counter, prescriptions, illicit drugs), all items previously/currently identified as a safety concern.  The family member/significant other verbalizes understanding of the suicide prevention education information provided.  The family member/significant other agrees to remove the items of safety concern listed above.  Carloyn Jaeger Grossman-Orr 02/26/2021, 5:28 PM

## 2021-02-26 NOTE — Progress Notes (Signed)
   02/26/21 0619  Vital Signs  Pulse Rate 93  BP (!) 128/99  BP Location Right Arm  BP Method Automatic  Patient Position (if appropriate) Standing  Oxygen Therapy  SpO2 98 %   D: Patient denies SI/HI/AVH. Patient rated anxiety 10/10 and depression 9/10. Pt. Stated " I would like to discharge" Pt. Isolated for most of this shift. A:  Patient took scheduled medicine.  Support and encouragement provided Routine safety checks conducted every 15 minutes. Patient  Informed to notify staff with any concerns.   R: Safety maintained.

## 2021-02-26 NOTE — Progress Notes (Signed)
[facility]  02/26/2021 11:46 AM [employer]  MRN:  [id number]   Chief Complaint: suicidal ideation and worsening depression in context of alcohol and cocaine abuse  Subjective:  Francisco [employer] is a 45 y.o. male who was [facility] for worsening depression and suicidal ideation in the context of homelessness and alcohol and cocaine abuse. The patient is currently on Hospital Day 3.   Chart Review from last 24 hours:  The patient's chart was reviewed and nursing notes were reviewed. The patient's case was discussed in multidisciplinary team meeting. Per nursing he slept 5.5 hours overnight. He attended 1 group and was visible some on the unit yesterday. [facility] with scheduled medications and did receive Vistaril X1 for anxiety and Trazodone for sleep. Recent CIWA scores: 6,10,5  Information Obtained Today During Patient Interview: The patient was seen and evaluated on the unit. He is irritable today and is pessimistic about his future. He recognizes that he is irritable and frustrated today. He states "I have it worse than anyone in here." When asked how he has arrived at this conclusion, he states that in group yesterday peers were talking about their families and supports and he feels he has no one. He states he has a mother in [place] who has dementia and he has a strained relationship with his stepfather. He goes on to state he is estranged from his daughter and does not feel he has peer supports. When challenged to consider how his addiction issues or behaviors may have contributed to his sense of isolation, he minimizes his addiction issues. He diverts the conversation to state that he is "ready to get out of here" and does not want a residential rehab program. He states he would [facility] but feels "I need my freedom." He was encouraged to reconsider a long-term program like [facility] or residential rehab and was advised that social work  can assist with [facility] options if desired. He denies current cravings for substances and specifically denies issues with chills, sweats, tremors, GI symptoms, or HA. He denies SI, HI, AVH, paranoia, or delusions. I discussed dose titration up on his Neurontin and Remeron to help with irritability and sleep and he agrees to this plan.   Principal Problem: MDD (major depressive disorder), recurrent severe, without psychosis (HCC) Diagnosis: Principal Problem:   MDD (major depressive disorder), recurrent severe, without psychosis (HCC) Active Problems:   Cocaine abuse (HCC)   Alcohol abuse  Total Time Spent in Direct Patient Care:  I personally spent 35 minutes on the unit in direct patient care. The direct patient care time included face-to-face time with the patient, reviewing the patient's chart, communicating with other professionals, and coordinating care. Greater than 50% of this time was spent in counseling or coordinating care with the patient regarding goals of hospitalization, psycho-education, and discharge planning needs.  Past Psychiatric History: see admission H&P  Past Medical History: HTN  Family History: see admission H&P  Family Psychiatric  History: see admission H&P  Social History:  Social History   Substance and Sexual Activity  Alcohol Use Yes   Comment: heavily     Social History   Substance and Sexual Activity  Drug Use Yes  . Types: Cocaine    Social History   Socioeconomic History  . Marital status: Divorced    Spouse name: Not on file  . Number of children: Not on file  . Years of education: Not on  file  . Highest education level: Not on file  Occupational History  . Not on file  Tobacco Use  . Smoking status: Current Every Day Smoker  . Smokeless tobacco: Never Used  Substance and Sexual Activity  . Alcohol use: Yes    Comment: heavily  . Drug use: Yes    Types: Cocaine  . Sexual activity: Not on file  Other Topics Concern  . Not on  file  Social History Narrative  . Not on file   Social Determinants of Health   Financial Resource Strain: Not on file  Food Insecurity: Not on file  Transportation Needs: Not on file  Physical Activity: Not on file  Stress: Not on file  Social Connections: Not on file   Sleep: Poor per patient - slept 5.5 hours per nursing  Appetite:  Improved  Current Medications: Current Facility-Administered Medications  Medication Dose Route Frequency Provider Last Rate Last Admin  . acetaminophen (TYLENOL) tablet 500 mg  500 mg Oral Q8H PRN Mason Jim, Laban Orourke E, MD      . alum & mag hydroxide-simeth (MAALOX/MYLANTA) 200-200-20 MG/5ML suspension 30 mL  30 mL Oral Q4H PRN Vernard Gambles H, NP      . cloNIDine (CATAPRES) tablet 0.1 mg  0.1 mg Oral Q8H PRN Mason Jim, Katharina Jehle E, MD      . gabapentin (NEURONTIN) capsule 200 mg  200 mg Oral TID Bartholomew Crews E, MD      . hydrOXYzine (ATARAX/VISTARIL) tablet 25 mg  25 mg Oral Q6H PRN Ardis Hughs, NP   25 mg at 02/25/21 2137  . loperamide (IMODIUM) capsule 2-4 mg  2-4 mg Oral PRN Ardis Hughs, NP      . LORazepam (ATIVAN) tablet 1 mg  1 mg Oral Q6H PRN Ardis Hughs, NP   1 mg at 02/24/21 0644  . LORazepam (ATIVAN) tablet 1 mg  1 mg Oral BID Ardis Hughs, NP       Followed by  . [START ON 02/27/2021] LORazepam (ATIVAN) tablet 1 mg  1 mg Oral Daily Vernard Gambles H, NP      . magnesium hydroxide (MILK OF MAGNESIA) suspension 30 mL  30 mL Oral Daily PRN Ardis Hughs, NP      . mirtazapine (REMERON) tablet 15 mg  15 mg Oral QHS Diamone Whistler E, MD      . multivitamin with minerals tablet 1 tablet  1 tablet Oral Daily Ardis Hughs, NP   1 tablet at 02/26/21 0736  . nicotine polacrilex (NICORETTE) gum 2 mg  2 mg Oral PRN Comer Locket, MD   2 mg at 02/26/21 0739  . OLANZapine zydis (ZYPREXA) disintegrating tablet 5 mg  5 mg Oral Q8H PRN Ardis Hughs, NP   5 mg at 02/23/21 1541   And  . ziprasidone (GEODON)  injection 20 mg  20 mg Intramuscular PRN Ardis Hughs, NP      . ondansetron (ZOFRAN-ODT) disintegrating tablet 4 mg  4 mg Oral Q6H PRN Ardis Hughs, NP      . pantoprazole (PROTONIX) EC tablet 40 mg  40 mg Oral Daily Comer Locket, MD   40 mg at 02/26/21 0736  . thiamine tablet 100 mg  100 mg Oral Daily Ardis Hughs, NP   100 mg at 02/26/21 0736  . traZODone (DESYREL) tablet 50 mg  50 mg Oral QHS PRN Ardis Hughs, NP   50 mg at 02/25/21 2137  Lab Results:  Results for orders placed or performed during the hospital encounter of 02/23/21 (from the past 48 hour(s))  Protime-INR     Status: None   Collection Time: 02/25/21  6:41 AM  Result Value Ref Range   Prothrombin Time 12.4 11.4 - 15.2 seconds   INR 0.9 0.8 - 1.2    Comment: (NOTE) INR goal varies based on device and disease states. Performed at [facility], [address] [place], [place] [address]   Hepatic function panel     Status: Abnormal   Collection Time: 02/25/21  6:41 AM  Result Value Ref Range   Total Protein 8.0 6.5 - 8.1 g/dL   Albumin 3.9 3.5 - 5.0 g/dL   AST 53 (H) 15 - 41 U/L   ALT 53 (H) 0 - 44 U/L   Alkaline Phosphatase 72 38 - 126 U/L   Total Bilirubin 0.9 0.3 - 1.2 mg/dL   Bilirubin, Direct 0.2 0.0 - 0.2 mg/dL   Indirect Bilirubin 0.7 0.3 - 0.9 mg/dL    Comment: Performed at [facility], [address], [place], [place] [address]  Hepatitis panel, acute     Status: None   Collection Time: 02/25/21  6:41 AM  Result Value Ref Range   Hepatitis B Surface Ag NON REACTIVE NON REACTIVE   HCV Ab NON REACTIVE NON REACTIVE    Comment: (NOTE) Nonreactive HCV antibody screen is consistent with no HCV infections,  unless recent infection is suspected or other evidence exists to indicate HCV infection.     Hep A IgM NON REACTIVE NON REACTIVE   Hep B C IgM NON REACTIVE NON REACTIVE    Comment: Performed at [facility], [address] [place], [place] [address]   Blood Alcohol level:  Lab Results  Component Value Date   ETH 325 (HH) 02/22/2021   ETH 370 (HH) 02/20/2021   Metabolic Disorder Labs: Lab Results  Component Value Date   HGBA1C 5.9 (H) 02/24/2021   MPG 122.63 02/24/2021   No results found for: PROLACTIN Lab Results  Component Value Date   CHOL 238 (H) 02/24/2021   TRIG 122 02/24/2021   HDL 92 02/24/2021   CHOLHDL 2.6 02/24/2021   VLDL 24 02/24/2021   LDLCALC 122 (H) 02/24/2021   Physical Findings: AIMS: Facial and Oral Movements Muscles of Facial Expression: None, normal Lips and Perioral Area: None, normal Jaw: None, normal Tongue: None, normal,Extremity Movements Upper (arms, wrists, hands, fingers): None, normal Lower (legs, knees, ankles, toes): None, normal, Trunk Movements Neck, shoulders, hips: None, normal, Overall Severity Severity of abnormal movements (highest score from questions above): None, normal Incapacitation due to abnormal movements: None, normal Patient's awareness of abnormal movements (rate only patient's report): No Awareness, Dental Status Current problems with teeth and/or dentures?: Yes Does patient usually wear dentures?: No  CIWA:  CIWA-Ar Total: 5    Musculoskeletal: Strength & Muscle Tone: within normal limits Gait & Station: normal, steady Patient leans: N/A  Psychiatric Specialty Exam: Physical Exam Vitals reviewed.  HENT:     Head: Normocephalic.  Pulmonary:     Effort: Pulmonary effort is normal.  Neurological:     Mental Status: He is alert.     Review of Systems  Constitutional: Negative for chills.  Respiratory: Negative for shortness of breath.   Cardiovascular: Negative for chest pain.  Gastrointestinal: Negative for diarrhea, nausea and vomiting.  Neurological: Negative for headaches.    Blood pressure (!) 128/99,  pulse 93, temperature (!) 97.5 F (36.4 C), temperature source Oral, resp. rate 16, height 5\' 6"  (1.676 m), weight 77.1  kg, SpO2 98 %.Body mass index is 27.44 kg/m.  General Appearance: Improved hygiene - has shaved and wearing scrubs  Eye Contact:  Fair  Speech:  Clear and Coherent and Normal Rate  Volume:  Normal  Mood:  Irritable  Affect:  Irritable, defensive  Thought Process:  Goal Directed and Linear but ruminative about psychosocial issues  Orientation:  Full (Time, Place, and Person)  Thought Content:  Logical and no evidence of delusions, paranoia, or psychosis on exam  Suicidal Thoughts:  No  Homicidal Thoughts:  No  Memory:  Recent;   Fair  Judgement:  Fair  Insight:  Lacking  Psychomotor Activity:  Normal  Concentration:  Concentration: Fair and Attention Span: Fair  Recall:  of Knowledge:  Fair  Language:  Good  Akathisia:  Negative  Assets:  Communication Skills Desire for Improvement Resilience  ADL's:  Intact  Cognition:  WNL  Sleep:  Number of Hours: 5.5   Treatment Plan Summary: Diagnoses / Active Problems: MDD recurrent severe without psychotic features (r/o substance induced depressive d/o) Alcohol use d/o Stimulant use d/o - cocaine type Tobacco use d/o  PLAN: 1. Safety and Monitoring:             -- Involuntary admission to inpatient psychiatric unit for safety, stabilization and treatment             -- Daily contact with patient to assess and evaluate symptoms and progress in treatment             -- Patient's case to be discussed in multi-disciplinary team meeting             -- Observation Level : q15 minute checks             -- Vital signs:  q12 hours             -- Precautions: suicide  2. Psychiatric Diagnoses and Treatment:              MDD recurrent severe without psychotic features (r/o substance induced depressive d/o)             -- Increase Remeron to 15mg  qhs to help with appetite, sleep, and mood             -- Increase Neurontin to 200mg  tid to help with mood              -- Continue Vistaril 25mg  q6 hours PRN anxiety             --  Continue Trazodone 50mg  po qhs PRN insomnia             -- Encouraged patient to participate in unit milieu and in scheduled group therapies              -- Short Term Goals: Ability to identify changes in lifestyle to reduce recurrence of condition will improve, Ability to demonstrate self-control will improve and Ability to identify and develop effective coping behaviors will improve             -- Long Term Goals: Improvement in symptoms so as ready for discharge              Alcohol use d/o             Stimulant use d/o - cocaine type             --  Continue CIWA protocol with scheduled Ativan taper for alcohol withdrawal             -- Oral thiamine and MVI replacement             -- Continue Protonix  daily for GI protection             -- Patient interested in [facility] after discharge - encouraged to reconsider [facility]/residential rehab             -- Short Term Goals: Ability to identify triggers associated with substance abuse/mental health issues will improve             -- Long Term Goals: Improvement in symptoms so as ready for discharge              3. Medical Issues Being Addressed:              Tobacco Use Disorder             -- Nicorette gum ordered PRN             -- Smoking cessation encouraged              HTN             -- will monitor BP as he completes alcohol detox and restart Norvasc  daily as needed             -- Clonidine 0.1mg  q8 hours PRN SBP >160 or DBP >110               Hyperlipidemia             -- Cholesterol 238 with LDL 122 - will need to see PCP for monitoring after discharge              Abnormal EKG with tachycardia and age indeterminate septal infarct             -- patient currently asymptomatic without CP or SOB             -- Repeat EKG 02/24/21 shows NSR 85bpm QTc and no signs of septal infarct or acute ischemia              Elevated LFTs             -- Repeat Hepatic function panel shows AST 53 down from 64 and ALT 53  down from 58; PT 12.4 and INR 0.9; and hepatitis panel nonreactive             -- Limiting Tylenol  4. Discharge Planning:              -- Social work and case management to assist with discharge planning and identification of hospital follow-up needs prior to discharge             -- Estimated LOS: 2-3 days             -- Discharge Concerns: Need to establish a safety plan; Medication compliance and effectiveness             -- Discharge Goals: Return home with outpatient referrals for mental health follow-up including medication management/psychotherapy  I certify that inpatient services furnished can reasonably be expected to improve the patient's condition.    Comer Locket, MD, FAPA 02/26/2021, 11:46 AM

## 2021-02-27 ENCOUNTER — Encounter (HOSPITAL_COMMUNITY): Payer: Self-pay | Admitting: Psychiatry

## 2021-02-27 MED ORDER — TRAZODONE HCL 50 MG PO TABS
50.0000 mg | ORAL_TABLET | Freq: Once | ORAL | Status: AC
  Administered 2021-02-27: 50 mg via ORAL
  Filled 2021-02-27 (×2): qty 1

## 2021-02-27 NOTE — Progress Notes (Signed)
   02/26/21 2130  Psych Admission Type (Psych Patients Only)  Admission Status Involuntary  Psychosocial Assessment  Patient Complaints Depression  Eye Contact Fair  Facial Expression Worried  Affect Anxious;Sad;Sullen;Appropriate to circumstance  Speech Logical/coherent  Interaction Assertive  Motor Activity Fidgety;Tremors  Appearance/Hygiene Improved  Behavior Characteristics Cooperative;Appropriate to situation  Mood Pleasant  Thought Process  Coherency Concrete thinking  Content Blaming self  Delusions None reported or observed  Perception WDL  Hallucination None reported or observed  Judgment Poor  Confusion None  Danger to Self  Current suicidal ideation? Denies  Danger to Others  Danger to Others None reported or observed

## 2021-02-27 NOTE — Progress Notes (Signed)
[facility] MD Progress Note  02/27/2021 12:20 PM Francisco Houston  MRN:  [id number]   Chief Complaint: suicidal ideation and worsening depression in context of alcohol and cocaine abuse  Subjective:  Francisco Houston is a 45 y.o. male who was transferred under IVC from [facility] for worsening depression and suicidal ideation in the context of homelessness and alcohol and cocaine abuse. The patient is currently on Hospital Day 4.   Chart Review from last 24 hours:  The patient's chart was reviewed and nursing notes were reviewed. The patient's case was discussed in multidisciplinary team meeting. Per nursing he had no behavioral issues or safety concerns noted and attended groups. Per [facility] he was compliant with scheduled medications and required no PRNS for agitation or anxiety. Recent CIWA scores: 5,0,0  Information Obtained Today During Patient Interview: The patient was seen and evaluated on the unit in the presence of social work. He denies current symptoms of substance withdrawal or cravings for alcohol or other drugs. He understands that there is a delay in obtaining a residential rehab bed at this time, and he instead would like to look into options for getting into an [facility] after discharge. He denies SI, HI, AVH, paranoia, or delusions. He reports improved appetite but ongoing issues with poor sleep. He voices no physical complaints.   Principal Problem: MDD (major depressive disorder), recurrent severe, without psychosis (HCC) Diagnosis: Principal Problem:   MDD (major depressive disorder), recurrent severe, without psychosis (HCC) Active Problems:   Cocaine abuse (HCC)   Alcohol abuse  Total Time Spent in Direct Patient Care:  I personally spent 30 minutes on the unit in direct patient care. The direct patient care time included face-to-face time with the patient, reviewing the patient's chart, communicating with other professionals, and coordinating care. Greater than 50% of this time was spent  in counseling or coordinating care with the patient regarding goals of hospitalization, psycho-education, and discharge planning needs.  Past Psychiatric History: see admission H&P  Past Medical History: HTN  Family History: see admission H&P  Family Psychiatric  History: see admission H&P  Social History:  Social History   Substance and Sexual Activity  Alcohol Use Yes   Comment: heavily     Social History   Substance and Sexual Activity  Drug Use Yes  . Types: Cocaine    Social History   Socioeconomic History  . Marital status: Divorced    Spouse name: Not on file  . Number of children: Not on file  . Years of education: Not on file  . Highest education level: Not on file  Occupational History  . Not on file  Tobacco Use  . Smoking status: Current Some Day Smoker  . Smokeless tobacco: Never Used  Substance and Sexual Activity  . Alcohol use: Yes    Comment: heavily  . Drug use: Yes    Types: Cocaine  . Sexual activity: Not on file  Other Topics Concern  . Not on file  Social History Narrative   ** Merged History Encounter **       Social Determinants of Health   Financial Resource Strain: Not on file  Food Insecurity: Not on file  Transportation Needs: Not on file  Physical Activity: Not on file  Stress: Not on file  Social Connections: Not on file   Sleep: Poor per patient - slept 6.5 hours per nursing  Appetite:  Improved  Current Medications: Current Facility-Administered Medications  Medication Dose Route Frequency Provider Last Rate Last Admin  .  acetaminophen (TYLENOL) tablet 500 mg  500 mg Oral Q8H PRN Mason Jim, Malaijah Houchen E, MD      . alum & mag hydroxide-simeth (MAALOX/MYLANTA) 200-200-20 MG/5ML suspension 30 mL  30 mL Oral Q4H PRN Vernard Gambles H, NP      . cloNIDine (CATAPRES) tablet 0.1 mg  0.1 mg Oral Q8H PRN Mason Jim, Elad Macphail E, MD      . gabapentin (NEURONTIN) capsule 200 mg  200 mg Oral TID Comer Locket, MD   200 mg at 02/27/21 0805   . magnesium hydroxide (MILK OF MAGNESIA) suspension 30 mL  30 mL Oral Daily PRN Ardis Hughs, NP      . mirtazapine (REMERON) tablet 15 mg  15 mg Oral QHS Comer Locket, MD   15 mg at 02/26/21 2124  . multivitamin with minerals tablet 1 tablet  1 tablet Oral Daily Ardis Hughs, NP   1 tablet at 02/27/21 0804  . nicotine polacrilex (NICORETTE) gum 2 mg  2 mg Oral PRN Comer Locket, MD   2 mg at 02/27/21 1016  . OLANZapine zydis (ZYPREXA) disintegrating tablet 5 mg  5 mg Oral Q8H PRN Ardis Hughs, NP   5 mg at 02/23/21 1541   And  . ziprasidone (GEODON) injection 20 mg  20 mg Intramuscular PRN Ardis Hughs, NP      . pantoprazole (PROTONIX) EC tablet 40 mg  40 mg Oral Daily Comer Locket, MD   40 mg at 02/27/21 0804  . thiamine tablet 100 mg  100 mg Oral Daily Ardis Hughs, NP   100 mg at 02/27/21 4081  . traZODone (DESYREL) tablet 50 mg  50 mg Oral QHS PRN Ardis Hughs, NP   50 mg at 02/25/21 2137    Lab Results:  No results found for this or any previous visit (from the past 48 hour(s)). Blood Alcohol level:  Lab Results  Component Value Date   ETH 325 (HH) 02/22/2021   ETH 370 (HH) 02/20/2021   Metabolic Disorder Labs: Lab Results  Component Value Date   HGBA1C 5.9 (H) 02/24/2021   MPG 122.63 02/24/2021   No results found for: PROLACTIN Lab Results  Component Value Date   CHOL 238 (H) 02/24/2021   TRIG 122 02/24/2021   HDL 92 02/24/2021   CHOLHDL 2.6 02/24/2021   VLDL 24 02/24/2021   LDLCALC 122 (H) 02/24/2021   Physical Findings: AIMS: Facial and Oral Movements Muscles of Facial Expression: None, normal Lips and Perioral Area: None, normal Jaw: None, normal Tongue: None, normal,Extremity Movements Upper (arms, wrists, hands, fingers): None, normal Lower (legs, knees, ankles, toes): None, normal, Trunk Movements Neck, shoulders, hips: None, normal, Overall Severity Severity of abnormal movements (highest score from questions  above): None, normal Incapacitation due to abnormal movements: None, normal Patient's awareness of abnormal movements (rate only patient's report): No Awareness, Dental Status Current problems with teeth and/or dentures?: Yes Does patient usually wear dentures?: No  CIWA:  CIWA-Ar Total: 0    Musculoskeletal: Strength & Muscle Tone: within normal limits Gait & Station: normal, steady Patient leans: N/A  Psychiatric Specialty Exam: Physical Exam Vitals reviewed.  HENT:     Head: Normocephalic.  Pulmonary:     Effort: Pulmonary effort is normal.  Neurological:     Mental Status: He is alert.     Review of Systems  Constitutional: Negative for chills.  Respiratory: Negative for shortness of breath.   Cardiovascular: Negative for chest pain.  Gastrointestinal:  Negative for diarrhea, nausea and vomiting.  Neurological: Negative for headaches.    Blood pressure (!) 128/91, pulse 72, temperature 98 F (36.7 C), temperature source Oral, resp. rate 16, height 5\' 6"  (1.676 m), weight 77.1 kg, SpO2 98 %.Body mass index is 27.44 kg/m.  General Appearance: Improved hygiene - has shaved and wearing scrubs  Eye Contact: Good  Speech:  Clear and Coherent and Normal Rate  Volume:  Normal  Mood: less irritable - described as improving  Affect: less irritable and less defensive today - calmer  Thought Process:  Goal Directed and Linear but ruminative about psychosocial issues  Orientation:  Full (Time, Place, and Person)  Thought Content:  Logical and no evidence of delusions, paranoia, or psychosis on exam  Suicidal Thoughts:  No  Homicidal Thoughts:  No  Memory:  Recent;   Fair  Judgement:  Fair  Insight: Improving  Psychomotor Activity:  Normal  Concentration:  Concentration: Fair and Attention Span: Fair  Recall:  of Knowledge:  Fair  Language:  Good  Akathisia:  Negative  Assets:  Communication Skills Desire for Improvement Resilience  ADL's:  Intact  Cognition:   WNL  Sleep:  Number of Hours: 6.5   Treatment Plan Summary: Diagnoses / Active Problems: MDD recurrent severe without psychotic features (r/o substance induced depressive d/o) Alcohol use d/o Stimulant use d/o - cocaine type Tobacco use d/o  PLAN: 1. Safety and Monitoring:             -- Involuntary admission to inpatient psychiatric unit for safety, stabilization and treatment             -- Daily contact with patient to assess and evaluate symptoms and progress in treatment             -- Patient's case to be discussed in multi-disciplinary team meeting             -- Observation Level : q15 minute checks             -- Vital signs:  q12 hours             -- Precautions: suicide  2. Psychiatric Diagnoses and Treatment:              MDD recurrent severe without psychotic features (r/o substance induced depressive d/o)             -- Continue Remeron 15mg  qhs to help with appetite, sleep, and mood             -- Continue Neurontin 200mg  tid to help with mood and potential cravings             -- Continue Vistaril 25mg  q6 hours PRN anxiety             -- Encouraged to try Trazodone 50mg  po qhs PRN insomnia             -- Encouraged patient to participate in unit milieu and in scheduled group therapies              -- Short Term Goals: Ability to identify changes in lifestyle to reduce recurrence of condition will improve, Ability to demonstrate self-control will improve and Ability to identify and develop effective coping behaviors will improve             -- Long Term Goals: Improvement in symptoms so as ready for discharge  Alcohol use d/o             Stimulant use d/o - cocaine type             -- Continue CIWA protocol and completing scheduled Ativan taper for alcohol withdrawal; has additional Ativan 1mg  for CIWA scores >10             -- Oral thiamine and MVI replacement             -- Continue Protonix 40mg  daily for GI protection             -- Patient  interested in [facility] after discharge and agrees to make calls today             -- Short Term Goals: Ability to identify triggers associated with substance abuse/mental health issues will improve             -- Long Term Goals: Improvement in symptoms so as ready for discharge              3. Medical Issues Being Addressed:              Tobacco Use Disorder             -- Nicorette gum ordered PRN             -- Smoking cessation encouraged              HTN             -- will monitor BP as he completes alcohol detox and restart Norvasc 10mg  daily as needed             -- Clonidine 0.1mg  q8 hours PRN SBP >160 or DBP >110               Hyperlipidemia             -- Cholesterol 238 with LDL 122 - will need to see PCP for monitoring after discharge              Abnormal EKG with tachycardia and age indeterminate septal infarct             -- patient currently asymptomatic without CP or SOB             -- Repeat EKG 02/24/21 shows NSR 85bpm QTc [facility] and no signs of septal infarct or acute ischemia              Elevated LFTs             -- Repeat Hepatic function panel shows AST 53 down from 64 and ALT 53 down from 58; PT 12.4 and INR 0.9; and hepatitis panel nonreactive             -- Limiting Tylenol  -- Needs PCP follow up after discharge  4. Discharge Planning:              -- Social work and case management to assist with discharge planning and identification of hospital follow-up needs prior to discharge             -- Estimated LOS: 2-3 days             -- Discharge Concerns: Need to establish a safety plan; Medication compliance and effectiveness             -- Discharge Goals: Return home with outpatient referrals for mental health follow-up including medication management/psychotherapy  I certify that inpatient services furnished can reasonably be expected to improve the patient's condition.    Comer LocketAmy E Kolby Myung, MD, FAPA 02/27/2021, 12:20 PM

## 2021-02-27 NOTE — Progress Notes (Signed)
Recreation Therapy Notes  Date:  5.2.22 Time: 0947 Location: [address] Dayroom  Group Topic: Stress Management  Goal Area(s) Addresses:  Patient will identify positive stress management techniques. Patient will identify benefits of using stress management post d/c.  Behavioral Response: Engaged  Intervention: Stress Management  Activity: Meditation.  LRT played meditation that focused on being able to free yourself by not holding on to things you can't change.  Patients were to listen and follow as meditation played to engage in activity.   Education:  Stress Management, Discharge Planning.   Education Outcome: Acknowledges Education  Clinical Observations/Feedback: Pt attended and participated in group activity.   Laural Eiland, LRT/CTRS         Jhania Etherington A 02/27/2021 10:57 AM 

## 2021-02-27 NOTE — BHH Group Notes (Signed)
LCSW Group Therapy Note  02/27/2021   Type of Therapy and Topic:  Group Therapy - Healthy vs Unhealthy Coping Skills  Participation Level:  Minimal  Description of Group The focus of this group was to determine what unhealthy coping techniques typically are used by group members and what healthy coping techniques would be helpful in coping with various problems. Patients were guided in becoming aware of the differences between healthy and unhealthy coping techniques. Patients were asked to identify 2-3 healthy coping skills they would like to learn to use more effectively.  Therapeutic Goals 1. Patients learned that coping is what human beings do all day long to deal with various situations in their lives 2. Patients defined and discussed healthy vs unhealthy coping techniques 3. Patients identified their preferred coping techniques and identified whether these were healthy or unhealthy 4. Patients determined 2-3 healthy coping skills they would like to become more familiar with and use more often. 5. Patients provided support and ideas to each other   Summary of Patient Progress:  Patient proved open to input from peers and feedback from CSW. Patient demonstrated insight into the subject matter, was respectful of peers, but did not participate in discussion throughout the entire session.   Therapeutic Modalities Cognitive Behavioral Therapy Motivational Interviewing  Chrys Racer 02/27/2021  3:28 PM

## 2021-02-27 NOTE — Progress Notes (Signed)
   02/26/21 2130  COVID-19 Daily Checkoff  Have you had a fever (temp > 37.80C/100F)  in the past 24 hours?  No  If you have had runny nose, nasal congestion, sneezing in the past 24 hours, has it worsened? No  COVID-19 EXPOSURE  Have you traveled outside the state in the past 14 days? No  Have you been in contact with someone with a confirmed diagnosis of COVID-19 or PUI in the past 14 days without wearing appropriate PPE? No  Have you been living in the same home as a person with confirmed diagnosis of COVID-19 or a PUI (household contact)? No  Have you been diagnosed with COVID-19? No

## 2021-02-27 NOTE — Progress Notes (Addendum)
Pt got PRN Trazodone with HS medication , pt requested something else to help him sleep @ 2345 . NP-Cody put 1x Trazodone 50 mg  That was given per [facility]

## 2021-02-27 NOTE — Progress Notes (Signed)
Pt stated he was doing better , pt calling [facility]    02/27/21 1900  Psych Admission Type (Psych Patients Only)  Admission Status Involuntary  Psychosocial Assessment  Patient Complaints Anxiety  Eye Contact Fair  Facial Expression Animated;Anxious;Worried  Affect Angry;Anxious;Irritable  Speech Logical/coherent  Interaction Assertive  Motor Activity Fidgety  Appearance/Hygiene Unremarkable  Behavior Characteristics Cooperative  Mood Anxious  Thought Process  Coherency Concrete thinking  Content Blaming others  Delusions None reported or observed  Perception WDL  Hallucination None reported or observed  Judgment Poor  Confusion None  Danger to Self  Current suicidal ideation? Denies  Danger to Others  Danger to Others None reported or observed

## 2021-02-27 NOTE — Progress Notes (Signed)
Progress note    02/27/21 0805  Psych Admission Type (Psych Patients Only)  Admission Status Involuntary  Psychosocial Assessment  Patient Complaints Agitation;Anxiety  Eye Contact Fair  Facial Expression Animated;Anxious;Worried  Affect Angry;Anxious;Irritable  Speech Logical/coherent  Interaction Assertive  Motor Activity Fidgety  Appearance/Hygiene Unremarkable  Behavior Characteristics Cooperative;Appropriate to situation;Anxious;Irritable  Mood Anxious;Irritable;Pleasant  Thought Process  Coherency Concrete thinking  Content Blaming others  Delusions None reported or observed  Perception WDL  Hallucination None reported or observed  Judgment Poor  Confusion None  Danger to Self  Current suicidal ideation? Denies  Danger to Others  Danger to Others None reported or observed

## 2021-02-27 NOTE — Plan of Care (Signed)
  Problem: Activity: Goal: Interest or engagement in activities will improve Outcome: Progressing   Problem: Coping: Goal: Ability to verbalize frustrations and anger appropriately will improve Outcome: Progressing Goal: Ability to demonstrate self-control will improve Outcome: Progressing   

## 2021-02-27 NOTE — BHH Group Notes (Signed)
Occupational Therapy Group Note Date: 02/27/2021 Group Topic/Focus: Brain Fitness  Group Description: Group encouraged increased social engagement and participation through discussion/activity focused on brain fitness. Patients were provided education on various brain fitness activities/strategies, with explanation provided on the qualifying factors including: one, that is has to be challenging/hard and two, it has to be something that you do not do every day. Patients engaged actively during group session in various brain fitness activities to increase attention, concentration, and problem-solving skills. Discussion followed with a focus on identifying the benefits of brain fitness activities as use for adaptive coping strategies and distraction.    Therapeutic Goal(s): Identify benefit(s) of brain fitness activities as use for adaptive coping and healthy distraction. Identify specific brain fitness activities to engage in as use for adaptive coping and healthy distraction. Participation Level: Active   Participation Quality: Independent   Behavior: Calm and Cooperative   Speech/Thought Process: Focused   Affect/Mood: Euthymic   Insight: Fair   Judgement: Fair   Individualization: Pt joined late, arrived to group 30 minutes later, however Cameran was active in their participation of group discussion/activity. Pt appeared receptive to information/education received on brain fitness and identified benefit of use as coping skill/distraction.   Modes of Intervention: Activity, Discussion, Education and Problem-solving  Patient Response to Interventions:  Attentive, Engaged, Receptive and Interested   Plan: Continue to engage patient in OT groups 2 - 3x/week.  02/27/2021  Donne Hazel, MOT, OTR/L

## 2021-02-27 NOTE — Progress Notes (Signed)
The patient rated his day as a 6 out of 10 since it was "confusing" for him. He verbalized that the discharge instructions keep changing and that he just want to go home.

## 2021-02-27 NOTE — BHH Counselor (Signed)
CSW gave pt a packet of homelessness resources.   Hutton Pellicane, LCSWA Clinicial Social Worker [facility] 

## 2021-02-27 NOTE — BHH Counselor (Signed)
Pt requested to discharge. CSW stated that pt would need to speak to MD for this final decision.   Fredirick Lathe, LCSWA Clinicial Social Worker [employer]

## 2021-02-27 NOTE — BHH Counselor (Signed)
CSW gave pt a list of [address] and encouraged him to call. Pt asked about residential treatment options as well. CSW explained that a referral has been sent to [facility] but that there will not be any beds open until next week so that an [facility] may be a better option at this time. Pt asked about [facility] because he stated he was unsure about having to complete an interview for an [employer] [employer]. CSW explained that [facility] is not currently open because they are moving their facility but that residential options will still require an interview. Pt stated that he understood.   Francisco Houston, LCSWA Clinicial Social Worker [employer]

## 2021-02-27 NOTE — BHH Group Notes (Signed)
Adult Psychoeducational Group Note  Date:  02/27/2021 Time:  10:32 AM  Group Topic/Focus:  Goals Group:   The focus of this group is to help patients establish daily goals to achieve during treatment and discuss how the patient can incorporate goal setting into their daily lives to aide in recovery.  Participation Level:  Active  Participation Quality:  Appropriate  Affect:  Appropriate  Cognitive:  Appropriate  Insight: Appropriate  Engagement in Group:  Engaged  Modes of Intervention:  Discussion  Additional Comments:  Patient attended morning goals and stress management group and participated.  Anica Alcaraz W Kholton Coate 02/27/2021, 10:32 AM

## 2021-02-28 MED ORDER — PANTOPRAZOLE SODIUM 40 MG PO TBEC: 40.0000 mg | DELAYED_RELEASE_TABLET | Freq: Every day | ORAL | 0 refills | Status: DC

## 2021-02-28 MED ORDER — GABAPENTIN 100 MG PO CAPS: 200.0000 mg | ORAL_CAPSULE | Freq: Three times a day (TID) | ORAL | 0 refills | Status: DC

## 2021-02-28 MED ORDER — MIRTAZAPINE 15 MG PO TABS: 15.0000 mg | ORAL_TABLET | Freq: Every day | ORAL | 0 refills | Status: DC

## 2021-02-28 NOTE — Progress Notes (Addendum)
Discharge Note:  Patient discharged to [facility].  Suicide prevention information given and discussed with patient who stated he understood and had no questions. Patient denied SI and HI.  Denied A/V hallucinations.  Denied pain.  Patient stated he received all his belongings from [facility], clothing, toiletries, misc items.  Patient stated he appreciated all assistance received from [facility] staff.  All required discharge information given to patient.

## 2021-02-28 NOTE — Plan of Care (Signed)
Nurse discussed coping skills with patient.  
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
## 2021-02-28 NOTE — Progress Notes (Signed)
  [facility] Adult Case Management Discharge Plan :  Will you be returning to the same living situation after discharge:  No.[facility] At discharge, do you have transportation home?: Yes,  Bus Tickets Do you have the ability to pay for your medications: No.  Release of information consent forms completed and in the chart;  Patient's signature needed at discharge.  Patient to Follow up at:  Follow-up Information    [facility]. Go on 03/07/2021.   Specialty: Behavioral Health Why: You have an appointment on 03/07/21 at 1:00 pm for (SAIOP) substance abuse intensive outpatient therapy and medication management services.This appointment will be held in person.  Contact information: [address] [place] [address] [phone]       Services, [facility] Follow up.   Why: Please call this facility daily to check on open beds.  Contact information: [place] [address] [phone]        [facility]. Go on 04/11/2021.   Why: You have a hospital follow up appointment for primary care services on 04/11/21 at 2:30 pm. This appointment will be held in person. Contact information: [address] [facility] [place] [address] [phone]              Next level of care provider has access to [facility] Link:yes  Safety Planning and Suicide Prevention discussed: Yes,  w/ friend     Has patient been referred to the Quitline?: Patient refused referral  Patient has been referred for addiction treatment: Yes  Felizardo Hoffmann, LCSWA 02/28/2021, 9:32 AM

## 2021-02-28 NOTE — Progress Notes (Cosign Needed)
Adult Psychoeducational Group Note  Date:  02/28/2021 Time:  9:45 AM  Group Topic/Focus:  Goals Group:   The focus of this group is to help patients establish daily goals to achieve during treatment and discuss how the patient can incorporate goal setting into their daily lives to aide in recovery.  Participation Level:  Active  Participation Quality:  Appropriate  Affect:  Appropriate  Cognitive:  Alert  Insight: Appropriate  Engagement in Group:  Engaged  Modes of Intervention:  Discussion  Additional Comments:  Pt attended and participated in goals group today.  Bryndle Corredor R Darrow Barreiro 02/28/2021, 9:45 AM

## 2021-02-28 NOTE — BHH Group Notes (Signed)
ADULT GRIEF GROUP NOTE:    Spiritual care group on grief and loss facilitated by chaplain Dyanne Carrel, [facility]    Group Goal:    Support / Education around grief and loss   Members engage in facilitated group support and psycho-social education.    Group Description:    Following introductions and group rules, group members engaged in facilitated group dialog and support around topic of loss, with particular support around experiences of loss in their lives. Group Identified types of loss (relationships / self / things) and identified patterns, circumstances, and changes that precipitate losses. Reflected on thoughts / feelings around loss, normalized grief responses, and recognized variety in grief experience.   Group noted Worden's four tasks of grief in discussion.    Group drew on Adlerian / Rogerian, narrative, MI,   Patient Progress:  Sherry attended and participated in group.  He shared that he feels he has lost everything and everyone in his life.  He does still have an 46 year old duaghter, though he has not been able to be in contact with her.  He actively engaged in the group conversation and was also an attentive listener as others shared.  Chaplain Dyanne Carrel, [facility] Pager, [phone] 2:16 PM

## 2021-02-28 NOTE — Progress Notes (Signed)
D;  Patient's self inventory sheet, patient has fair sleep, sleep medication helpful.  Good appetite, normal energy level, good concentration.  Rated depression and anxiety 1, denied hopeless.  Denied withdrawals.  Denied SI.  Denied physical problems.  Denied physical pain.  Goal is discharge.  Plans to discharge.  Does have discharge plans. A:  Medications administered per MD orders.  Emotional support and encouragement given patient. R:  Denied SI and HI, contracts for safety.  Denied A/V hallucinations.  Safety maintained with 15 minute checks.

## 2021-02-28 NOTE — BHH Suicide Risk Assessment (Signed)
[facility] Discharge Suicide Risk Assessment   Principal Problem: MDD (major depressive disorder), recurrent severe, without psychosis (HCC) Discharge Diagnoses: Principal Problem:   MDD (major depressive disorder), recurrent severe, without psychosis (HCC) Active Problems:   Cocaine abuse (HCC)   Alcohol abuse   Total Time spent with patient: 15 minutes  Musculoskeletal: Strength & Muscle Tone: within normal limits Gait & Station: normal Patient leans: N/A  Psychiatric Specialty Exam: Review of Systems  All other systems reviewed and are negative.   Blood pressure (!) 119/94, pulse 84, temperature 97.7 F (36.5 C), temperature source Oral, resp. rate 16, height 5\' 6"  (1.676 m), weight 77.1 kg, SpO2 99 %.Body mass index is 27.44 kg/m.  General Appearance: Casual  Eye Contact::  Good  Speech:  Normal Rate409  Volume:  Normal  Mood:  Euthymic  Affect:  Congruent  Thought Process:  Coherent and Descriptions of Associations: Intact  Orientation:  Full (Time, Place, and Person)  Thought Content:  Logical  Suicidal Thoughts:  No  Homicidal Thoughts:  No  Memory:  Immediate;   Fair Recent;   Fair Remote;   Fair  Judgement:  Intact  Insight:  Fair  Psychomotor Activity:  Normal  Concentration:  Good  Recall:  Good  Fund of Knowledge:Good  Language: Good  Akathisia:  Negative  Handed:  Right  AIMS (if indicated):     Assets:  Desire for Improvement Housing Resilience  Sleep:  Number of Hours: 6.5  Cognition: WNL  ADL's:  Intact   Mental Status Per Nursing Assessment::   On Admission:  Self-harm thoughts  Demographic Factors:  Male, Caucasian, Living alone and Unemployed  Loss Factors: NA  Historical Factors: Impulsivity  Risk Reduction Factors:   Positive coping skills or problem solving skills  Continued Clinical Symptoms:  Depression:   Comorbid alcohol abuse/dependence Impulsivity Alcohol/Substance Abuse/Dependencies  Cognitive Features That Contribute  To Risk:  None    Suicide Risk:  Minimal: No identifiable suicidal ideation.  Patients presenting with no risk factors but with morbid ruminations; may be classified as minimal risk based on the severity of the depressive symptoms   Follow-up Information    [facility]. Go on 03/07/2021.   Specialty: Behavioral Health Why: You have an appointment on 03/07/21 at 1:00 pm for (SAIOP) substance abuse intensive outpatient therapy and medication management services.This appointment will be held in person.  Contact information: [address] [place] [phone]       [facility] Follow up.   Why: Please call this facility daily to check on open beds.  Contact information: [phone] [place] [phone]        [facility]. Go on 04/11/2021.   Why: You have a hospital follow up appointment for primary care services on 04/11/21 at 2:30 pm. This appointment will be held in person. Contact information: [address] [place] [phone]              Plan Of Care/Follow-up recommendations:  Activity:  ad lib  [phone], MD 02/28/2021, 9:10 AM

## 2021-03-07 ENCOUNTER — Ambulatory Visit (HOSPITAL_COMMUNITY): Payer: Federal, State, Local not specified - Other | Admitting: Behavioral Health

## 2021-03-10 ENCOUNTER — Emergency Department (HOSPITAL_COMMUNITY)
Admission: EM | Admit: 2021-03-10 | Discharge: 2021-03-11 | Disposition: A | Discharge status: Home / Self Care | Payer: Self-pay | Attending: Emergency Medicine | Admitting: Emergency Medicine

## 2021-03-10 ENCOUNTER — Encounter (HOSPITAL_COMMUNITY): Payer: Self-pay

## 2021-03-10 ENCOUNTER — Other Ambulatory Visit: Payer: Self-pay

## 2021-03-10 ENCOUNTER — Emergency Department (HOSPITAL_COMMUNITY): Payer: Self-pay

## 2021-03-10 DIAGNOSIS — F10288 Alcohol dependence with other alcohol-induced disorder: Secondary | ICD-10-CM | POA: Insufficient documentation

## 2021-03-10 DIAGNOSIS — R079 Chest pain, unspecified: Secondary | ICD-10-CM | POA: Insufficient documentation

## 2021-03-10 DIAGNOSIS — Z5321 Procedure and treatment not carried out due to patient leaving prior to being seen by health care provider: Secondary | ICD-10-CM | POA: Insufficient documentation

## 2021-03-10 LAB — COMPREHENSIVE METABOLIC PANEL
ALT: 43 U/L (ref 0–44)
AST: 44 U/L — ABNORMAL HIGH (ref 15–41)
Albumin: 3.9 g/dL (ref 3.5–5.0)
Alkaline Phosphatase: 77 U/L (ref 38–126)
Anion gap: 10 (ref 5–15)
BUN: <5 mg/dL — ABNORMAL LOW (ref 6–20)
CO2: 24 mmol/L (ref 22–32)
Calcium: 8.9 mg/dL (ref 8.9–10.3)
Chloride: 106 mmol/L (ref 98–111)
Creatinine, Ser: 0.83 mg/dL (ref 0.61–1.24)
GFR, Estimated: >60 mL/min (ref >60)
Glucose, Bld: 101 mg/dL — ABNORMAL HIGH (ref 70–99)
Potassium: 3.6 mmol/L (ref 3.5–5.1)
Sodium: 140 mmol/L (ref 135–145)
Total Bilirubin: 0.4 mg/dL (ref 0.3–1.2)
Total Protein: 7.6 g/dL (ref 6.5–8.1)

## 2021-03-10 LAB — CBC WITH DIFFERENTIAL/PLATELET
Abs Immature Granulocytes: 0.02 10*3/uL (ref 0.00–0.07)
Basophils Absolute: 0.2 10*3/uL — ABNORMAL HIGH (ref 0.0–0.1)
Basophils Relative: 2 %
Eosinophils Absolute: 0.1 10*3/uL (ref 0.0–0.5)
Eosinophils Relative: 1 %
HCT: 46.1 % (ref 39.0–52.0)
Hemoglobin: 15.8 g/dL (ref 13.0–17.0)
Immature Granulocytes: 0 %
Lymphocytes Relative: 32 %
Lymphs Abs: 2 10*3/uL (ref 0.7–4.0)
MCH: 33.7 pg (ref 26.0–34.0)
MCHC: 34.3 g/dL (ref 30.0–36.0)
MCV: 98.3 fL (ref 80.0–100.0)
Monocytes Absolute: 0.6 10*3/uL (ref 0.1–1.0)
Monocytes Relative: 9 %
Neutro Abs: 3.5 10*3/uL (ref 1.7–7.7)
Neutrophils Relative %: 56 %
Platelets: 276 10*3/uL (ref 150–400)
RBC: 4.69 MIL/uL (ref 4.22–5.81)
RDW: 13.2 % (ref 11.5–15.5)
WBC: 6.3 10*3/uL (ref 4.0–10.5)
nRBC: 0 % (ref 0.0–0.2)

## 2021-03-10 LAB — RAPID URINE DRUG SCREEN, HOSP PERFORMED
Amphetamines: NOT DETECTED
Barbiturates: NOT DETECTED
Benzodiazepines: NOT DETECTED
Cocaine: NOT DETECTED
Opiates: NOT DETECTED
Tetrahydrocannabinol: NOT DETECTED

## 2021-03-10 LAB — TROPONIN I (HIGH SENSITIVITY): Troponin I (High Sensitivity): 7 ng/L (ref <18)

## 2021-03-10 LAB — ETHANOL: Alcohol, Ethyl (B): 396 mg/dL (ref <10)

## 2021-03-10 NOTE — ED Triage Notes (Signed)
[place] [facility] [place], hx of homelessness, patient stated to EMS that he was "having a heart attack", refused ASA and nitro, ETOH on board, multiple psych admissions previously  CBG 106  152/100

## 2021-03-10 NOTE — ED Notes (Signed)
Pt called x3 to be taken back to room. Moving pt OTF.

## 2021-03-10 NOTE — ED Triage Notes (Signed)
Patient denies SI/HI, patient agitated at triage

## 2021-03-10 NOTE — ED Provider Notes (Signed)
Emergency Medicine Provider Triage Evaluation Note  Francisco Houston , a 45 y.o. male  was evaluated in triage.  Pt complains of having a heart attack.  Feels like his heart is "contracting."  Started 4 hours prior.  Rates pain 8/10 on the pain scale.  Patient reports pain is improved when he hits his chest.  Endorses etoh uses today; states that he drank at least 15 beers. Last drink was just prior to being picked up by EMS.   Endorses daily alcohol use.   Denies any drug.   Patient denies any suicidal ideations, homicidal ideations, hallucinations, visual hallucinations.  Patient becomes agitated when asked these questis.    Review of Systems  Positive: Shob,  Negative: Nausea, vomiting, chest pain, suicidal ideations, homicidal ideations, visual hallucinations, auditory hallucinations  Physical Exam  BP (!) 150/99 (BP Location: Right Arm)   Pulse 83   Temp 98.8 F (37.1 C) (Oral)   Resp 18   SpO2 95%  Gen:   Awake, no distress   Resp:  Normal effort  MSK:   Moves extremities without difficulty  Other:    Medical Decision Making  Medically screening exam initiated at 9:47 PM.  Appropriate orders placed.  Francisco Houston was informed that the remainder of the evaluation will be completed by another provider, this initial triage assessment does not replace that evaluation, and the importance of remaining in the ED until their evaluation is complete.  The patient appears stable so that the remainder of the work up may be completed by another provider.     Berneice Heinrich 03/10/21 2157    Terrilee Files, MD 03/11/21 1236

## 2021-03-13 ENCOUNTER — Ambulatory Visit (HOSPITAL_COMMUNITY)
Admission: EM | Admit: 2021-03-13 | Discharge: 2021-03-14 | Disposition: A | Discharge status: Home / Self Care | Payer: No Payment, Other | Attending: Registered Nurse | Admitting: Registered Nurse

## 2021-03-13 ENCOUNTER — Encounter (HOSPITAL_COMMUNITY): Payer: Self-pay | Admitting: Registered Nurse

## 2021-03-13 ENCOUNTER — Other Ambulatory Visit: Payer: Self-pay

## 2021-03-13 DIAGNOSIS — Z20822 Contact with and (suspected) exposure to covid-19: Secondary | ICD-10-CM | POA: Insufficient documentation

## 2021-03-13 DIAGNOSIS — F1994 Other psychoactive substance use, unspecified with psychoactive substance-induced mood disorder: Secondary | ICD-10-CM | POA: Diagnosis present

## 2021-03-13 DIAGNOSIS — F1414 Cocaine abuse with cocaine-induced mood disorder: Secondary | ICD-10-CM | POA: Diagnosis not present

## 2021-03-13 DIAGNOSIS — I445 Left posterior fascicular block: Secondary | ICD-10-CM | POA: Insufficient documentation

## 2021-03-13 DIAGNOSIS — F1024 Alcohol dependence with alcohol-induced mood disorder: Secondary | ICD-10-CM | POA: Diagnosis not present

## 2021-03-13 DIAGNOSIS — F10229 Alcohol dependence with intoxication, unspecified: Secondary | ICD-10-CM | POA: Insufficient documentation

## 2021-03-13 DIAGNOSIS — F1410 Cocaine abuse, uncomplicated: Secondary | ICD-10-CM | POA: Diagnosis present

## 2021-03-13 DIAGNOSIS — F332 Major depressive disorder, recurrent severe without psychotic features: Secondary | ICD-10-CM | POA: Insufficient documentation

## 2021-03-13 DIAGNOSIS — F1010 Alcohol abuse, uncomplicated: Secondary | ICD-10-CM | POA: Diagnosis present

## 2021-03-13 LAB — ETHANOL: Alcohol, Ethyl (B): 241 mg/dL — ABNORMAL HIGH (ref <10)

## 2021-03-13 LAB — CBC WITH DIFFERENTIAL/PLATELET
Abs Immature Granulocytes: 0.02 10*3/uL (ref 0.00–0.07)
Basophils Absolute: 0.1 10*3/uL (ref 0.0–0.1)
Basophils Relative: 1 %
Eosinophils Absolute: 0 10*3/uL (ref 0.0–0.5)
Eosinophils Relative: 1 %
HCT: 46.8 % (ref 39.0–52.0)
Hemoglobin: 16.1 g/dL (ref 13.0–17.0)
Immature Granulocytes: 0 %
Lymphocytes Relative: 25 %
Lymphs Abs: 1.4 10*3/uL (ref 0.7–4.0)
MCH: 33.7 pg (ref 26.0–34.0)
MCHC: 34.4 g/dL (ref 30.0–36.0)
MCV: 97.9 fL (ref 80.0–100.0)
Monocytes Absolute: 0.4 10*3/uL (ref 0.1–1.0)
Monocytes Relative: 7 %
Neutro Abs: 3.7 10*3/uL (ref 1.7–7.7)
Neutrophils Relative %: 66 %
Platelets: 217 10*3/uL (ref 150–400)
RBC: 4.78 MIL/uL (ref 4.22–5.81)
RDW: 13 % (ref 11.5–15.5)
WBC: 5.7 10*3/uL (ref 4.0–10.5)
nRBC: 0 % (ref 0.0–0.2)

## 2021-03-13 LAB — POCT URINE DRUG SCREEN - MANUAL ENTRY (I-SCREEN)
POC Amphetamine UR: NOT DETECTED
POC Buprenorphine (BUP): NOT DETECTED
POC Cocaine UR: NOT DETECTED
POC Marijuana UR: NOT DETECTED
POC Methadone UR: NOT DETECTED
POC Methamphetamine UR: NOT DETECTED
POC Morphine: NOT DETECTED
POC Oxazepam (BZO): NOT DETECTED
POC Oxycodone UR: NOT DETECTED
POC Secobarbital (BAR): NOT DETECTED

## 2021-03-13 LAB — URINALYSIS, ROUTINE W REFLEX MICROSCOPIC
Bacteria, UA: NONE SEEN
Bilirubin Urine: NEGATIVE
Glucose, UA: NEGATIVE mg/dL
Ketones, ur: NEGATIVE mg/dL
Leukocytes,Ua: NEGATIVE
Nitrite: NEGATIVE
Protein, ur: 30 mg/dL — AB
Specific Gravity, Urine: 1.002 — ABNORMAL LOW (ref 1.005–1.030)
pH: 6 (ref 5.0–8.0)

## 2021-03-13 LAB — MAGNESIUM: Magnesium: 2.3 mg/dL (ref 1.7–2.4)

## 2021-03-13 LAB — POC SARS CORONAVIRUS 2 AG: SARSCOV2ONAVIRUS 2 AG: NEGATIVE

## 2021-03-13 LAB — COMPREHENSIVE METABOLIC PANEL
ALT: 46 U/L — ABNORMAL HIGH (ref 0–44)
AST: 54 U/L — ABNORMAL HIGH (ref 15–41)
Albumin: 4.1 g/dL (ref 3.5–5.0)
Alkaline Phosphatase: 83 U/L (ref 38–126)
Anion gap: 11 (ref 5–15)
BUN: <5 mg/dL — ABNORMAL LOW (ref 6–20)
CO2: 25 mmol/L (ref 22–32)
Calcium: 9.1 mg/dL (ref 8.9–10.3)
Chloride: 101 mmol/L (ref 98–111)
Creatinine, Ser: 0.84 mg/dL (ref 0.61–1.24)
GFR, Estimated: >60 mL/min (ref >60)
Glucose, Bld: 86 mg/dL (ref 70–99)
Potassium: 3.2 mmol/L — ABNORMAL LOW (ref 3.5–5.1)
Sodium: 137 mmol/L (ref 135–145)
Total Bilirubin: 0.7 mg/dL (ref 0.3–1.2)
Total Protein: 8.1 g/dL (ref 6.5–8.1)

## 2021-03-13 LAB — POC SARS CORONAVIRUS 2 AG -  ED: SARS Coronavirus 2 Ag: NEGATIVE

## 2021-03-13 LAB — TSH: TSH: 0.547 u[IU]/mL (ref 0.350–4.500)

## 2021-03-13 LAB — RESP PANEL BY RT-PCR (FLU A&B, COVID) ARPGX2
Influenza A by PCR: NEGATIVE
Influenza B by PCR: NEGATIVE
SARS Coronavirus 2 by RT PCR: NEGATIVE

## 2021-03-13 MED ORDER — CHLORDIAZEPOXIDE HCL 25 MG PO CAPS: 25.0000 mg | ORAL_CAPSULE | Freq: Three times a day (TID) | ORAL | Status: DC

## 2021-03-13 MED ORDER — THIAMINE HCL 100 MG PO TABS
100.0000 mg | ORAL_TABLET | Freq: Every day | ORAL | Status: DC
  Administered 2021-03-14: 100 mg via ORAL
  Filled 2021-03-13: qty 1

## 2021-03-13 MED ORDER — PANTOPRAZOLE SODIUM 40 MG PO TBEC
40.0000 mg | DELAYED_RELEASE_TABLET | Freq: Every day | ORAL | Status: DC
  Administered 2021-03-13 – 2021-03-14 (×2): 40 mg via ORAL
  Filled 2021-03-13: qty 7
  Filled 2021-03-13 (×2): qty 1

## 2021-03-13 MED ORDER — CHLORDIAZEPOXIDE HCL 25 MG PO CAPS: 25.0000 mg | ORAL_CAPSULE | ORAL | Status: DC

## 2021-03-13 MED ORDER — HYDROXYZINE HCL 25 MG PO TABS: 25.0000 mg | ORAL_TABLET | Freq: Four times a day (QID) | ORAL | Status: DC | PRN

## 2021-03-13 MED ORDER — GABAPENTIN 100 MG PO CAPS
200.0000 mg | ORAL_CAPSULE | Freq: Three times a day (TID) | ORAL | Status: DC
Start: 2021-03-13 — End: 2021-03-14
  Administered 2021-03-13 – 2021-03-14 (×3): 200 mg via ORAL
  Filled 2021-03-13: qty 2
  Filled 2021-03-13: qty 42
  Filled 2021-03-13 (×2): qty 2

## 2021-03-13 MED ORDER — LOPERAMIDE HCL 2 MG PO CAPS: 2.0000 mg | ORAL_CAPSULE | ORAL | Status: DC | PRN

## 2021-03-13 MED ORDER — ALUM & MAG HYDROXIDE-SIMETH 200-200-20 MG/5ML PO SUSP: 30.0000 mL | ORAL | Status: DC | PRN

## 2021-03-13 MED ORDER — ONDANSETRON 4 MG PO TBDP: 4.0000 mg | ORAL_TABLET | Freq: Four times a day (QID) | ORAL | Status: DC | PRN

## 2021-03-13 MED ORDER — MIRTAZAPINE 15 MG PO TABS
15.0000 mg | ORAL_TABLET | Freq: Every day | ORAL | Status: DC
  Administered 2021-03-13: 15 mg via ORAL
  Filled 2021-03-13: qty 1
  Filled 2021-03-13: qty 7

## 2021-03-13 MED ORDER — TRAZODONE HCL 50 MG PO TABS: 50.0000 mg | ORAL_TABLET | Freq: Every evening | ORAL | Status: DC | PRN

## 2021-03-13 MED ORDER — ADULT MULTIVITAMIN W/MINERALS CH
1.0000 | ORAL_TABLET | Freq: Every day | ORAL | Status: DC
  Administered 2021-03-13 – 2021-03-14 (×2): 1 via ORAL
  Filled 2021-03-13 (×2): qty 1

## 2021-03-13 MED ORDER — ACETAMINOPHEN 325 MG PO TABS: 650.0000 mg | ORAL_TABLET | Freq: Four times a day (QID) | ORAL | Status: DC | PRN

## 2021-03-13 MED ORDER — CHLORDIAZEPOXIDE HCL 25 MG PO CAPS
25.0000 mg | ORAL_CAPSULE | Freq: Every day | ORAL | Status: AC
  Administered 2021-03-13: 25 mg via ORAL

## 2021-03-13 MED ORDER — CHLORDIAZEPOXIDE HCL 25 MG PO CAPS
25.0000 mg | ORAL_CAPSULE | Freq: Four times a day (QID) | ORAL | Status: DC
  Administered 2021-03-13 – 2021-03-14 (×2): 25 mg via ORAL
  Filled 2021-03-13 (×3): qty 1

## 2021-03-13 MED ORDER — CHLORDIAZEPOXIDE HCL 25 MG PO CAPS: 25.0000 mg | ORAL_CAPSULE | Freq: Four times a day (QID) | ORAL | Status: DC | PRN

## 2021-03-13 MED ORDER — MAGNESIUM HYDROXIDE 400 MG/5ML PO SUSP: 30.0000 mL | Freq: Every day | ORAL | Status: DC | PRN

## 2021-03-13 NOTE — ED Notes (Signed)
Pt sleeping at present, no distress noted, monitoring for safety. 

## 2021-03-13 NOTE — Discharge Instructions (Addendum)
Patient is instructed prior to discharge to: Take all medications as prescribed by his/her mental healthcare provider. Report any adverse effects and or reactions from the medicines to his/her outpatient provider promptly. Patient has been instructed & cautioned: To not engage in alcohol and or illegal drug use while on prescription medicines. In the event of worsening symptoms, patient is instructed to call the crisis hotline, 911 and or go to the nearest ED for appropriate evaluation and treatment of symptoms. To follow-up with his/her primary care provider for your other medical issues, concerns and or health care needs.      Keep scheduled Appointment with  [facility]. Go on 04/11/2021.   Why: You have a hospital follow up appointment for primary care services on 04/11/21 at 2:30 pm. This appointment will be held in person. Contact information: [address] [place] [address] [phone]         Substance Abuse Resources  [facility] - Admissions are currently completed Monday through Friday at 8am; both appointments and walk-ins are accepted.  Any individual that is a [facility] resident may present for a substance abuse screening and assessment for admission.  A person may be referred by numerous sources or self-refer.   Potential clients will be screened for medical necessity and appropriateness for the program.  Clients must meet criteria for high-intensity residential treatment services.  If clinically appropriate, a client will continue with the comprehensive clinical assessment and intake process, as well as enrollment in the [facility].   Address: [address] [place], [place] [address] Admin Hours: Mon-Fri 8AM to [facility] Hours: 24/7 Phone: [phone] Fax: [phone]   [facility] (Detox) Facility Based Crisis:  These are 3 locations for services: Please call  before arrival    Address: [address] [place], [place] [address] Phone: [phone]   Address: [address] [place], [place] [address] Phone#: [phone]   Address: [address] [place], [place] [address] Phone#: [phone]     [facility] ([facility]): (offers outpatient therapy and intensive outpatient substance abuse therapy).  [address] [place], [place] [address] Phone: [phone]   [facility] : Offers FREE recovery skills classes, support groups, 1:1 Peer Support, and Compeer Classes. [address], [place], [place] [address] Phone: [phone] (Call to complete intake).   [facility]  [address] [place], [place] [address] The [employer] provides food, shelter and other programs and services to the homeless men of [place] through our [employer]. By offering safe shelter, three meals a day, clean clothing, Biblical counseling, financial planning, vocational training, GED/education and employment assistance, we've helped mend the shattered lives of many homeless men since opening in 1974. We have approximately 267 beds available, with a max of 312 beds including mats for emergency situations and currently house an average of 270 men a night. Prospective Client Check-In Information Must provide a photo I.D. within 30 days of your acceptance into the [facility] program. Help out with chores around the [facility]. No sex offender of any type (pending, charged, registered and/or any other sex related offenses) will be permitted to check in. Must be willing to abide by all rules, regulations, and policies established by the [employer]. The following will be provided - shelter, food, clothing, and biblical counseling. If you or someone you  know is in need of assistance at our [facility] shelter in [place], [place], please call [phone] ext.  [phone]. The [employer] The [employer] is the [employer] 77-month recovery program designed to provide holistic restoration for addicted men and women. Through addiction counseling and other related programs, participants will be able to return to the workforce and become a contributing member of society once again. [facility] [address] [place], [place] [address] Phone: [phone] ext: [phone] The [facility] provides food, shelter and other programs and services to the homeless men of [place] through our [employer].   By offering safe shelter, three meals a day, clean clothing, Biblical counseling, financial planning, vocational training, GED/education and employment assistance, we've helped mend the shattered lives of many homeless men since opening in 1974.   We have approximately 267 beds available, with a max of 312 beds including mats for emergency situations and currently house an average of 270 men a night.   Prospective Client Check-In Information Photo ID Required (State/ Out of State/ [facility]) - if photo ID is not available, clients are required to have a printout of a police/sheriff's criminal history report. Help out with chores around the [facility]. No sex offender of any type (pending, charged, registered and/or any other sex related offenses) will be permitted to check in. Must be willing to abide by all rules, regulations, and policies established by the [employer]. The following will be provided - shelter, food, clothing, and biblical counseling. If you or someone you know is in need of assistance at our [facility] shelter in [place], [place], please call [phone] ext. [phone].   [facility]-will provide timely access to mental health services for children and adolescents (4-17) and adults presenting in a mental health crisis. The program is designed for those who need urgent  Behavioral Health or Substance Use treatment and are not experiencing a medical crisis that would typically require an emergency room visit.    [address] [place], [place] [address] Phone: [phone] [email or web address]   [facility]: Phone#: [phone]   The [facility] SA Intensive Outpatient Program (SAIOP) means structured individual and group addiction activities and services that are provided at an outpatient program designed to assist adult and adolescent consumers to begin recovery and learn skills for recovery maintenance. The [employer] SAIOP program is offered at least 3 hours a day, 3 days a week.SAIOP services shall include a structured program consisting of, but not limited to, the following services: Individual counseling and support; Group counseling and support; Family counseling, training or support; Biochemical assays to identify recent drug use (e.g., urine drug screens); Strategies for relapse prevention to include community and social support systems in treatment; Life skills; Crisis contingency planning; Disease Management; and Treatment support activities that have been adapted or specifically designed for persons with physical disabilities, or persons with co-occurring disorders of mental illness and substance abuse/dependence or mental retardation/developmental disability and substance abuse/dependence. Phone: [phone]  Address:    The [facility] will also offer the following outpatient services: (Monday through Friday 8am-5pm)   Partial Hospitalization Program (PHP) Substance Abuse Intensive Outpatient Program (SA-IOP) Group Therapy Medication Management Peer Living Room We also provide (24/7):  Assessments: Our mental health clinician and providers will conduct a focused mental health evaluation, assessing for immediate safety concerns and further mental health needs. Referral: Our team will provide  resources and help connect to community based  mental health treatment, when indicated, including psychotherapy, psychiatry, and other specialized behavioral health or substance use disorder services (for those not already in treatment). Transitional Care: Our team providers in person bridging and/or telephonic follow-up during the patient's transition to outpatient services.   [facility] 24-Hour Call Center: [phone]  Behavioral Health Crisis Line: [phone]
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
## 2021-03-13 NOTE — ED Provider Notes (Signed)
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 0.80  0.61 - 1.24 mg/dL Final  . Calcium 66/03/3015 8.9  8.9 - 10.3 mg/dL Final  . Total Protein 02/20/2021 8.4* 6.5 - 8.1 g/dL Final  . Albumin 11/06/3233 4.2  3.5 - 5.0 g/dL Final  . AST 57/32/2025 51* 15 - 41 U/L  Final  . ALT 02/20/2021 47* 0 - 44 U/L  Final  . Alkaline Phosphatase 02/20/2021 73  38 - 126 U/L Final  . Total Bilirubin 02/20/2021 0.5  0.3 - 1.2 mg/dL Final  . GFR, Estimated 02/20/2021 >60  >60 mL/min Final   Comment: (NOTE) Calculated using the CKD-EPI Creatinine Equation (2021)   . Anion gap 02/20/2021 16* 5 - 15 Final   Performed at [facility], [address], [place], [place] [address]  . WBC 02/20/2021 8.1  4.0 - 10.5 K/uL Final  . RBC 02/20/2021 5.11  4.22 - 5.81 MIL/uL Final  . Hemoglobin 02/20/2021 17.4* 13.0 - 17.0 g/dL Final  . HCT 60/45/4098 49.3  39.0 - 52.0 % Final  . MCV 02/20/2021 96.5  80.0 - 100.0 fL Final  . MCH 02/20/2021 34.1* 26.0 - 34.0 pg Final  . MCHC 02/20/2021 35.3  30.0 - 36.0 g/dL Final  . RDW 11/91/4782 12.3  11.5 - 15.5 % Final  . Platelets 02/20/2021 207  150 - 400 K/uL Final  . nRBC 02/20/2021 0.0  0.0 - 0.2 % Final  . Neutrophils Relative % 02/20/2021 57  % Final  . Neutro Abs 02/20/2021 4.6  1.7 - 7.7 K/uL Final  . Lymphocytes Relative 02/20/2021 30  % Final  . Lymphs Abs 02/20/2021 2.4  0.7 - 4.0 K/uL Final  . Monocytes Relative 02/20/2021 11  % Final  . Monocytes Absolute 02/20/2021 0.9  0.1 - 1.0 K/uL Final  . Eosinophils Relative 02/20/2021 1  % Final  . Eosinophils Absolute 02/20/2021 0.0  0.0 - 0.5 K/uL Final  . Basophils Relative 02/20/2021 1  % Final  . Basophils Absolute 02/20/2021 0.1  0.0 - 0.1 K/uL Final  . Immature Granulocytes 02/20/2021 0  % Final  . Abs Immature Granulocytes 02/20/2021 0.03  0.00 - 0.07 K/uL Final   Performed at [facility], [address] [place], [place] [address]  . Opiates 02/20/2021 NONE DETECTED  NONE DETECTED Final  . Cocaine 02/20/2021 NONE DETECTED  NONE DETECTED Final  . Benzodiazepines 02/20/2021 NONE DETECTED  NONE DETECTED Final  . Amphetamines 02/20/2021 NONE DETECTED  NONE DETECTED Final  . Tetrahydrocannabinol 02/20/2021 NONE DETECTED  NONE DETECTED  Final  . Barbiturates 02/20/2021 NONE DETECTED  NONE DETECTED Final   Comment: (NOTE) DRUG SCREEN FOR MEDICAL PURPOSES ONLY.  IF CONFIRMATION IS NEEDED FOR ANY PURPOSE, NOTIFY LAB WITHIN 5 DAYS.  LOWEST DETECTABLE LIMITS FOR URINE DRUG SCREEN Drug Class                     Cutoff (ng/mL) Amphetamine and metabolites    1000 Barbiturate and metabolites    200 Benzodiazepine                 200 Tricyclics and metabolites     300 Opiates and metabolites        300 Cocaine and metabolites        300 THC                            50 Performed at [facility], [address] [place], [place] [address]   . Salicylate Lvl 02/20/2021 <7.0* 7.0 - 30.0 mg/dL Final   Performed at [facility], [address], [place], [place] [address]  . Alcohol, Ethyl (B) 02/20/2021 370* <10 mg/dL Final   Comment: CRITICAL RESULT CALLED TO, READ BACK BY AND VERIFIED WITH: Edson Snowball AT 2112 ON 02/20/2021 BY  MOSLEY,J (NOTE) Lowest detectable limit for serum alcohol is 10 mg/dL.  For medical purposes only. Performed at [facility], [address], [place], [place] [address]   . Acetaminophen (Tylenol), Serum 02/20/2021 <10* 10 - 30 ug/mL Final   Comment: (NOTE) Therapeutic concentrations vary significantly. A range of 10-30 ug/mL  may be an effective concentration for many patients. However, some  are best treated at concentrations outside of this range. Acetaminophen concentrations >150 ug/mL at 4 hours after ingestion  and >50 ug/mL at 12 hours after ingestion are often associated with  toxic reactions.  Performed at [facility], [address], [place], [place] [address]   Admission on 02/20/2021, Discharged on 02/20/2021  Component Date Value Ref Range Status  . Sodium 02/20/2021 138  135 - 145 mmol/L Final  . Potassium 02/20/2021 3.8  3.5 - 5.1 mmol/L Final  . Chloride 02/20/2021 101  98 - 111 mmol/L Final  .  CO2 02/20/2021 23  22 - 32 mmol/L Final  . Glucose, Bld 02/20/2021 116* 70 - 99 mg/dL Final   Glucose reference range applies only to samples taken after fasting for at least 8 hours.  . BUN 02/20/2021 7  6 - 20 mg/dL Final  . Creatinine, Ser 02/20/2021 0.84  0.61 - 1.24 mg/dL Final  . Calcium 12/45/8099 9.1  8.9 - 10.3 mg/dL Final  . Total Protein 02/20/2021 8.5* 6.5 - 8.1 g/dL Final  . Albumin 83/38/2505 4.1  3.5 - 5.0 g/dL Final  . AST 39/76/7341 48* 15 - 41 U/L Final  . ALT 02/20/2021 46* 0 - 44 U/L Final  . Alkaline Phosphatase 02/20/2021 73  38 - 126 U/L Final  . Total Bilirubin 02/20/2021 0.7  0.3 - 1.2 mg/dL Final  . GFR, Estimated 02/20/2021 >60  >60 mL/min Final   Comment: (NOTE) Calculated using the CKD-EPI Creatinine Equation (2021)   . Anion gap 02/20/2021 14  5 - 15 Final   Performed at [facility], [address], [place], [place] [address]  . WBC 02/20/2021 7.1  4.0 - 10.5 K/uL Final  . RBC 02/20/2021 5.16  4.22 - 5.81 MIL/uL Final  . Hemoglobin 02/20/2021 17.4* 13.0 - 17.0 g/dL Final  . HCT 24/06/7352 50.0  39.0 - 52.0 % Final  . MCV 02/20/2021 96.9  80.0 - 100.0 fL Final  . MCH 02/20/2021 33.7  26.0 - 34.0 pg Final  . MCHC 02/20/2021 34.8  30.0 - 36.0 g/dL Final  . RDW 29/92/4268 12.3  11.5 - 15.5 % Final  . Platelets 02/20/2021 205  150 - 400 K/uL Final  . nRBC 02/20/2021 0.0  0.0 - 0.2 % Final  . Neutrophils Relative % 02/20/2021 59  % Final  . Neutro Abs 02/20/2021 4.2  1.7 - 7.7 K/uL Final  . Lymphocytes Relative 02/20/2021 28  % Final  . Lymphs Abs 02/20/2021 2.0  0.7 - 4.0 K/uL Final  . Monocytes Relative 02/20/2021 12  % Final  . Monocytes Absolute 02/20/2021 0.8  0.1 - 1.0 K/uL Final  . Eosinophils Relative 02/20/2021 0  % Final  . Eosinophils Absolute 02/20/2021 0.0  0.0 - 0.5 K/uL Final  . Basophils Relative 02/20/2021 1  % Final  . Basophils Absolute 02/20/2021 0.1  0.0 - 0.1 K/uL Final  . Immature Granulocytes 02/20/2021 0  %  Final  . Abs Immature Granulocytes 02/20/2021 0.02  0.00 - 0.07 K/uL Final   Performed at [facility], [address], [place], [place] [address]  . Alcohol, Ethyl (B) 02/20/2021 380* <10 mg/dL Final   Comment: CRITICAL RESULT CALLED TO, READ BACK BY AND VERIFIED WITH: R.GOODWIN, RN AT 1753 ON 04.25.22 BY N.THOMPSON (NOTE) Lowest detectable limit for serum alcohol is 10 mg/dL.  For medical purposes only. Performed at [facility], [address], [place], [place] [address]   . Acetaminophen (Tylenol), Serum 02/20/2021 <10* 10 - 30 ug/mL Final   Comment: (NOTE) Therapeutic concentrations vary significantly. A range of 10-30 ug/mL  may be an effective concentration for many patients. However, some  are best treated at concentrations outside of this range. Acetaminophen concentrations >150 ug/mL at 4 hours after ingestion  and >50 ug/mL at 12 hours after ingestion are often associated with  toxic reactions.  Performed at [facility], [address] [place], [place] [address]   . Salicylate Lvl 02/20/2021 <7.0* 7.0 - 30.0 mg/dL Final   Performed at [facility], [address] [place], [place] [address]  Admission on 02/12/2021, Discharged on 02/13/2021  Component Date Value Ref Range Status  . Opiates 02/12/2021 NONE DETECTED  NONE DETECTED Final  . Cocaine 02/12/2021 POSITIVE* NONE DETECTED Final  . Benzodiazepines 02/12/2021 NONE DETECTED  NONE DETECTED Final  . Amphetamines 02/12/2021 NONE DETECTED  NONE DETECTED Final  . Tetrahydrocannabinol 02/12/2021 NONE DETECTED  NONE DETECTED Final  . Barbiturates 02/12/2021 NONE DETECTED  NONE DETECTED Final   Comment: (NOTE) DRUG SCREEN FOR MEDICAL PURPOSES ONLY.  IF CONFIRMATION IS NEEDED FOR ANY PURPOSE, NOTIFY LAB WITHIN 5 DAYS.  LOWEST DETECTABLE LIMITS FOR URINE DRUG SCREEN Drug Class                     Cutoff (ng/mL) Amphetamine and metabolites     1000 Barbiturate and metabolites    200 Benzodiazepine                 200 Tricyclics and metabolites     300 Opiates and metabolites        300 Cocaine and metabolites        300 THC                            50 Performed at [facility], [address], [place], [place] [address]   . Fecal Occult Bld 02/13/2021 POSITIVE* NEGATIVE Final  . Sodium 02/13/2021 139  135 - 145 mmol/L Final  . Potassium 02/13/2021 3.6  3.5 - 5.1 mmol/L Final  . Chloride 02/13/2021 104  98 - 111 mmol/L Final  . CO2 02/13/2021 23  22 - 32 mmol/L Final  . Glucose, Bld 02/13/2021 94  70 - 99 mg/dL Final   Glucose reference range applies only to samples taken after fasting for at least 8 hours.  . BUN 02/13/2021 <5* 6 - 20 mg/dL Final  . Creatinine, Ser 02/13/2021 0.71  0.61 - 1.24 mg/dL Final  . Calcium 13/05/6577 9.0  8.9 - 10.3 mg/dL Final  . Total Protein 02/13/2021 7.3  6.5 - 8.1 g/dL Final  . Albumin 46/96/2952 3.4* 3.5 - 5.0 g/dL Final  . AST 84/13/2440 183* 15 - 41 U/L Final  . ALT 02/13/2021 116* 0 - 44 U/L Final  . Alkaline Phosphatase 02/13/2021 71  38 - 126 U/L Final  . Total Bilirubin 02/13/2021 0.6  0.3 - 1.2 mg/dL Final  . GFR, Estimated 02/13/2021 >60  >60 mL/min Final  Comment: (NOTE) Calculated using the CKD-EPI Creatinine Equation (2021)   . Anion gap 02/13/2021 12  5 - 15 Final   Performed at [facility], [address], [place], [place] [address]  . WBC 02/13/2021 7.3  4.0 - 10.5 K/uL Final  . RBC 02/13/2021 4.86  4.22 - 5.81 MIL/uL Final  . Hemoglobin 02/13/2021 16.2  13.0 - 17.0 g/dL Final  . HCT 60/45/4098 48.0  39.0 - 52.0 % Final  . MCV 02/13/2021 98.8  80.0 - 100.0 fL Final  . MCH 02/13/2021 33.3  26.0 - 34.0 pg Final  . MCHC 02/13/2021 33.8  30.0 - 36.0 g/dL Final  . RDW 11/91/4782 12.2  11.5 - 15.5 % Final  . Platelets 02/13/2021 270  150 - 400 K/uL Final  . nRBC 02/13/2021 0.0  0.0 - 0.2 % Final  . Neutrophils Relative % 02/13/2021 54  % Final  . Neutro  Abs 02/13/2021 4.0  1.7 - 7.7 K/uL Final  . Lymphocytes Relative 02/13/2021 33  % Final  . Lymphs Abs 02/13/2021 2.4  0.7 - 4.0 K/uL Final  . Monocytes Relative 02/13/2021 9  % Final  . Monocytes Absolute 02/13/2021 0.6  0.1 - 1.0 K/uL Final  . Eosinophils Relative 02/13/2021 3  % Final  . Eosinophils Absolute 02/13/2021 0.2  0.0 - 0.5 K/uL Final  . Basophils Relative 02/13/2021 1  % Final  . Basophils Absolute 02/13/2021 0.1  0.0 - 0.1 K/uL Final  . Immature Granulocytes 02/13/2021 0  % Final  . Abs Immature Granulocytes 02/13/2021 0.02  0.00 - 0.07 K/uL Final   Performed at [facility], [address], [place], [place] [address]    Allergies: Patient has no known allergies.  PTA Medications: (Not in a hospital admission)   Medical Decision Making  Patient admitted to Continuous Assessment Unit for safety, stabilization, and treatment for alcohol withdrawal.    Lab Orders     Resp Panel by RT-PCR (Flu A&B, Covid) Nasopharyngeal Swab     CBC with Differential/Platelet     Comprehensive metabolic panel     Magnesium     Ethanol     TSH     Urinalysis, Routine w reflex microscopic Urine, Clean Catch     POC SARS Coronavirus 2 Ag-ED - Nasal Swab     POCT Urine Drug Screen - (ICup)   Medication Management:   Meds ordered this encounter  Medications  . acetaminophen (TYLENOL) tablet 650 mg  . alum & mag hydroxide-simeth (MAALOX/MYLANTA) 200-200-20 MG/5ML suspension 30 mL  . magnesium hydroxide (MILK OF MAGNESIA) suspension 30 mL  . thiamine tablet 100 mg  . multivitamin with minerals tablet 1 tablet  . chlordiazePOXIDE (LIBRIUM) capsule 25 mg  . hydrOXYzine (ATARAX/VISTARIL) tablet 25 mg  . loperamide (IMODIUM) capsule 2-4 mg  . ondansetron (ZOFRAN-ODT) disintegrating tablet 4 mg  . FOLLOWED BY Linked Order Group   . chlordiazePOXIDE (LIBRIUM) capsule 25 mg   . chlordiazePOXIDE (LIBRIUM) capsule 25 mg   . chlordiazePOXIDE (LIBRIUM) capsule 25 mg   .  chlordiazePOXIDE (LIBRIUM) capsule 25 mg  . DISCONTD: traZODone (DESYREL) tablet 50 mg  . pantoprazole (PROTONIX) EC tablet 40 mg  . mirtazapine (REMERON) tablet 15 mg  . gabapentin (NEURONTIN) capsule 200 mg   Patient given resources for long term rehab facilities with phone numbers and encouraged to call for phone intake interview.  [facility], [facility], [employer].)    Recommendations  Based on my  evaluation the patient does not appear to have an emergency medical condition.      Other resources Given:   Discharge Instructions         Keep scheduled Appointment with  [facility]. Go on 04/11/2021.   Why: You have a hospital follow up appointment for primary care services on 04/11/21 at 2:30 pm. This appointment will be held in person. Contact information: [address] [place] [address] [phone]         Substance Abuse Resources  [facility] - Admissions are currently completed Monday through Friday at 8am; both appointments and walk-ins are accepted.  Any individual that is a [facility] resident may present for a substance abuse screening and assessment for admission.  A person may be referred by numerous sources or self-refer.   Potential clients will be screened for medical necessity and appropriateness for the program.  Clients must meet criteria for high-intensity residential treatment services.  If clinically appropriate, a client will continue with the comprehensive clinical assessment and intake process, as well as enrollment in the [facility].   Address: [address] [place], [place] [address] Admin Hours: Mon-Fri 8AM to [facility] Hours: 24/7 Phone: [phone] Fax: [phone]   [facility] (Detox) Facility Based Crisis:  These are 3 locations for services: Please call before arrival    Address: [address] [place], [place] [address] Phone: [phone]   Address: [address] [place], [place] [address] Phone#: [phone]   Address: [address] [place], [place] [address] Phone#: [phone]     [facility] ([facility]): (offers outpatient therapy and intensive outpatient substance abuse therapy).  [address], [place], [place] [address] Phone: [phone]   [facility]: Offers FREE recovery skills classes, support groups, 1:1 Peer Support, and Compeer Classes. [address], [place], [place] [address] Phone: [phone] (Call to complete intake).   [facility]  [address] [place], [place] [address] The [employer] provides food, shelter and other programs and services to the homeless men of [place] through our [employer]. By offering safe shelter, three meals a day, clean clothing, Biblical counseling, financial planning, vocational training, GED/education and employment assistance, we've helped mend the shattered lives of many homeless men since opening in 1974. We have approximately 267 beds available, with a max of 312 beds including mats for emergency situations and currently house an average of 270 men a night. Prospective Client Check-In Information . Must provide a photo I.D. within 30 days of your acceptance into the [facility] program. . Help out with chores around the Mission. . No sex offender of any type (pending, charged, registered and/or any other sex related offenses) will be permitted to check in. . Must be willing to abide by all rules, regulations, and policies established by the [employer]. . The following will be provided - shelter, food, clothing, and biblical counseling. If you or someone you know is in need of assistance at our [facility] shelter in [place], [place], please call [phone] ext. [phone]. The [employer] is the [employer] 5-month recovery program designed to provide holistic restoration for addicted men and women. Through addiction counseling and other related programs, participants will be able to return to the workforce and become a contributing member of society once  again. [facility] [address] [place], [place] [address] Phone: [phone] ext: [phone] The [facility] provides food, shelter and other programs and services to the homeless men of [place] through our [employer] [facility] program.   By offering safe shelter, three meals a day, clean clothing, Biblical counseling, financial planning, vocational training, GED/education and employment assistance, we've helped mend the shattered lives of many homeless men since opening in 1974.   We have approximately 267 beds available, with a max of 312 beds including mats for emergency situations and currently house an average of 270 men a night.   Prospective Client Check-In Information Photo ID Required (State/ Out of State/ [facility]) - if photo ID is not available, clients are required to have a printout of a police/sheriff's criminal history report. Help out with chores around the [facility]. No sex offender of any type (pending, charged, registered and/or any other sex related offenses) will be permitted to check in. Must be willing to abide by all rules, regulations, and policies established by the [employer]. The following will be provided - shelter, food, clothing, and biblical counseling. If you or someone you know is in need of assistance at our [facility] in [place], [place], please call [phone] ext. [phone].   [facility]will provide timely access to mental health services for children and adolescents (4-17) and adults presenting in a mental health crisis. The program is designed for those who need urgent Behavioral Health or Substance Use  treatment and are not experiencing a medical crisis that would typically require an emergency room visit.    [address] [place], [place] [address] Phone: [phone] [email or web address]   [facility]: Phone#: [phone]   The [facility] SA Intensive Outpatient Program (SAIOP) means structured individual and group addiction activities and services that are provided at an outpatient program designed to assist adult and adolescent consumers to begin recovery and learn skills for recovery maintenance. The [employer] SAIOP program is offered at least 3 hours a day, 3 days a week.SAIOP services shall include a structured program consisting of, but not limited to, the following services: Individual counseling and support; Group counseling and support; Family counseling, training or support; Biochemical assays to identify recent drug use (e.g., urine drug screens); Strategies for relapse prevention to include community and social support systems in treatment; Life skills; Crisis contingency planning; Disease Management; and Treatment support activities that have been adapted or specifically designed for persons with physical disabilities, or persons with co-occurring disorders of mental illness and substance abuse/dependence or mental retardation/developmental disability and substance abuse/dependence. Phone: [phone]  Address:    [facility] will also offer the following outpatient services: (Monday through Friday 8am-5pm)    Partial Hospitalization Program (PHP)  Substance Abuse Intensive Outpatient Program (SA-IOP)  Group Therapy  Medication Management  Peer Living Room We also provide (24/7):  Assessments: Our mental health clinician and providers will conduct a focused mental health evaluation, assessing for immediate safety concerns and further mental health needs. Referral: Our team will provide resources and help connect to  community based mental health treatment, when indicated, including psychotherapy, psychiatry, and other specialized behavioral health or substance use disorder services (for those not already in treatment). Transitional Care: Our team providers in person bridging and/or telephonic follow-up during the patient's transition to outpatient services.   [facility] 24-Hour Call Center: [phone]  Behavioral Health Crisis Line: [phone]  Lynisha Osuch, NP 03/13/21  5:09 PM

## 2021-03-13 NOTE — ED Notes (Signed)
PATIENT ARRIVED ON UNIT
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
## 2021-03-13 NOTE — ED Notes (Signed)
Pt bought in by [employer] and admitted to continuous assessment endorsing SI with plan to cut self and ETOH abuse. Pt request inpatient rehab. Calm, cooperative throughout assessment. Oriented to unit and unit rules. Will monitor for safety.

## 2021-03-13 NOTE — ED Notes (Signed)
Meal was given, but pt said his stomach was hurting and did not eat.

## 2021-03-13 NOTE — BH Assessment (Signed)
Per provider, Assunta Found, NP, patient to remain in the ED overnight pending am psych evaluation.  Shuvon Rankin, NP, requested this Disposition Counselor to reach out to the [facility]) to inquire about their bed availability.  Contacted [phone] and unsuccessful reaching a live person. Provided updates to [facility], NP.

## 2021-03-13 NOTE — Progress Notes (Signed)
Patient received scheduled medications orally. Patient grateful for medication.

## 2021-03-14 MED ORDER — PANTOPRAZOLE SODIUM 40 MG PO TBEC: 40.0000 mg | DELAYED_RELEASE_TABLET | Freq: Every day | ORAL | 0 refills | Status: DC

## 2021-03-14 MED ORDER — AMLODIPINE BESYLATE 10 MG PO TABS: 10.0000 mg | ORAL_TABLET | Freq: Every day | ORAL | 0 refills | Status: DC

## 2021-03-14 MED ORDER — GABAPENTIN 100 MG PO CAPS: 200.0000 mg | ORAL_CAPSULE | Freq: Three times a day (TID) | ORAL | 0 refills | Status: DC

## 2021-03-14 MED ORDER — MIRTAZAPINE 15 MG PO TABS: 15.0000 mg | ORAL_TABLET | Freq: Every day | ORAL | 0 refills | Status: DC

## 2021-03-14 MED ORDER — AMLODIPINE BESYLATE 10 MG PO TABS
10.0000 mg | ORAL_TABLET | Freq: Every day | ORAL | Status: DC
  Administered 2021-03-14: 10 mg via ORAL
  Filled 2021-03-14: qty 1
  Filled 2021-03-14: qty 7
  Filled 2021-03-14: qty 1

## 2021-03-14 NOTE — ED Notes (Signed)
Pt sleeping at present, no distress noted, monitoring for safety. 

## 2021-03-14 NOTE — Progress Notes (Signed)
Francisco Houston received his discharge order, AVS and questions answered. He retrieved his personal belongings and was transported via Safe transport to his designation.

## 2021-03-14 NOTE — ED Notes (Signed)
Pt given meal

## 2021-03-14 NOTE — Progress Notes (Signed)
Received Francisco Houston this AM asleep in his chair bed,he was awaken for assessment,  breakfast and medicated per order. Later he spoke with the Social worker and made plans to go to [facility]. Shortly thereafter he told this Clinical research associate he wants a bus token and his discharge papers. He stated he is not ready to stop drinking and plan to leave here and go to the liquor store. He stated he knows  how to talk to people to get what he wants, a drink.

## 2021-03-14 NOTE — Progress Notes (Signed)
CSW spoke with pt and assisted the patient in calling the following:  [facility] Pt not appropriate for their detox program.  Pt declined to continue the screening once he heard there was a 3-4 week waitlist.  [facility] Pt was deemed appropriate.  However, pt is declining this program stating that it is similar to [facility] and he was apprehensive.  He reports that he thought the program to be "weird".  [facility] Pt declined interview after learning of the religious component and requirement to attend church 3x week.  [facility] No male beds available currently, possible that beds will become open later in week.  [facility]  Not accepting individuals until June.  [facility] Available beds and pt aligned with attending.  Penni Homans, MSW, LCSW 03/14/2021 12:48 PM

## 2021-03-14 NOTE — ED Provider Notes (Signed)
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  He states that he does not feel suicidal or homicidal and denies any hallucinations.  Patient reports that he is interested in going to substance abuse treatment again.  He is requesting to speak with social work about his options.  Social work assist the patient with getting accepted at the [facility] for detox with the potential to go to residential treatment, however, this patient was ready for discharge and waiting on safe transport he decided that he was not ready to go to detox at this time.  Patient reports to me that he knows that he needs to quit drinking but he mainly needs to get control with his life again.  He states that after his divorce and the death of his sister he went into a deep depression and lost his good paying job, lost his house, his truck was repossessed, and was living with his parents.  However due to his substance abuse he is unable to live with him again and that he is bounced from place to place since this happened.  He states that he needs to go back to work and that he needs to find a place to live.  Patient reports that he has been offered a job here in [place] and needs to get over there to speak to the people about starting work.  [facility] [place] but does request to have the list of resources so he has place to contact if he changes his mind.  Patient continues to deny any suicidal or homicidal ideations and denies hallucinations.  Patient is very pleasant, calm,  cooperative and reports appreciation for us trying to get him into treatment again.  Stay Summary: Patient is a 45 year old male presented to the [facility] accompanied by law enforcement voluntarily with complaints of suicidal ideations with a plan to cut himself.  Patient was discharged [facility] 2 weeks ago for similar complaints.  Patient abuses alcohol and his BAL on arrival was 241.  Patient was admitted to the continuous observation unit and restarted on medications.  Today the patient is pleasant, calm, cooperative and is denying any suicidal or homicidal ideations and denies any.  Patient was given assistance via social work with getting placement for substance abuse treatment however once patient was ready for discharge to refuse to go to a detox facility.  Patient stated that he was not ready to go through treatment again as he has been through multiple times.  Patient is requesting discharge home to the street as he is homeless and is requesting transportation to [employer] where he is potentially going to start a job.  Patient has continued to deny any suicidal or homicidal ideations and denying hallucinations.  Patient was provided with 7-day samples of his medications as well as 30-day prescriptions of his medications and provided with numerous outpatient resources.  Total Time spent with patient: 30 minutes  Past Psychiatric History: alcohol dependence, MDD, previous hospitalization, previous substance abuse treatment Past Medical History:  Past Medical History:  Diagnosis Date  .  Alcohol intoxication (HCC)   . Diabetes mellitus without complication (HCC)   . Homelessness   . PTSD (post-traumatic stress disorder)    History reviewed. No pertinent surgical history. Family History: History reviewed. No pertinent family history. Family Psychiatric History: None reported Social History:  Social History   Substance and Sexual Activity  Alcohol Use Yes   Comment: heavily      Social History   Substance and Sexual Activity  Drug Use Yes  . Types: Cocaine    Social History   Socioeconomic History  . Marital status: Divorced    Spouse name: Not on file  . Number of children: Not on file  . Years of education: Not on file  . Highest education level: Not on file  Occupational History  . Not on file  Tobacco Use  . Smoking status: Current Some Day Smoker  . Smokeless tobacco: Never Used  Substance and Sexual Activity  . Alcohol use: Yes    Comment: heavily  . Drug use: Yes    Types: Cocaine  . Sexual activity: Not on file  Other Topics Concern  . Not on file  Social History Narrative   ** Merged History Encounter **       Social Determinants of Health   Financial Resource Strain: Not on file  Food Insecurity: Not on file  Transportation Needs: Not on file  Physical Activity: Not on file  Stress: Not on file  Social Connections: Not on file   SDOH:  SDOH Screenings   Alcohol Screen: Medium Risk  . Last Alcohol Screening Score (AUDIT): 29  Depression (PHQ2-9): Not on file  Financial Resource Strain: Not on file  Food Insecurity: Not on file  Housing: Not on file  Physical Activity: Not on file  Social Connections: Not on file  Stress: Not on file  Tobacco Use: High Risk  . Smoking Tobacco Use: Current Some Day Smoker  . Smokeless Tobacco Use: Never Used  Transportation Needs: Not on file    Has this patient used any form of tobacco in the last 30 days? (Cigarettes, Smokeless Tobacco, Cigars, and/or Pipes) A prescription for an FDA-approved tobacco cessation medication was offered at discharge and the patient refused  Current Medications:  Current Facility-Administered Medications  Medication Dose Route Frequency Provider Last Rate Last Admin  . acetaminophen (TYLENOL) tablet 650 mg  650 mg Oral Q6H PRN Rankin, Shuvon B, NP      . alum & mag hydroxide-simeth (MAALOX/MYLANTA) 200-200-20 MG/5ML suspension 30 mL  30 mL Oral Q4H PRN  Rankin, Shuvon B, NP      . amLODipine (NORVASC) tablet 10 mg  10 mg Oral Daily Avalin Briley B, FNP   10 mg at 03/14/21 0901  . chlordiazePOXIDE (LIBRIUM) capsule 25 mg  25 mg Oral Q6H PRN Rankin, Shuvon B, NP      . chlordiazePOXIDE (LIBRIUM) capsule 25 mg  25 mg Oral QID Rankin, Shuvon B, NP   25 mg at 03/14/21 0858   Followed by  . [START ON 03/15/2021] chlordiazePOXIDE (LIBRIUM) capsule 25 mg  25 mg Oral TID Rankin, Shuvon B, NP       Followed by  . [START ON 03/16/2021] chlordiazePOXIDE (LIBRIUM) capsule 25 mg  25 mg Oral BH-qamhs Rankin, Shuvon B, NP      . gabapentin (NEURONTIN) capsule 200 mg  200 mg Oral TID Rankin, Shuvon B, NP   200 mg at 03/14/21 0857  . hydrOXYzine (ATARAX/VISTARIL) tablet 25 mg  25 mg Oral  Q6H PRN Rankin, Shuvon B, NP      . loperamide (IMODIUM) capsule 2-4 mg  2-4 mg Oral PRN Rankin, Shuvon B, NP      . magnesium hydroxide (MILK OF MAGNESIA) suspension 30 mL  30 mL Oral Daily PRN Rankin, Shuvon B, NP      . mirtazapine (REMERON) tablet 15 mg  15 mg Oral QHS Rankin, Shuvon B, NP   15 mg at 03/13/21 2117  . multivitamin with minerals tablet 1 tablet  1 tablet Oral Daily Rankin, Shuvon B, NP   1 tablet at 03/14/21 0858  . ondansetron (ZOFRAN-ODT) disintegrating tablet 4 mg  4 mg Oral Q6H PRN Rankin, Shuvon B, NP      . pantoprazole (PROTONIX) EC tablet 40 mg  40 mg Oral Daily Rankin, Shuvon B, NP   40 mg at 03/14/21 0858  . thiamine tablet 100 mg  100 mg Oral Daily Rankin, Shuvon B, NP   100 mg at 03/14/21 1517   Current Outpatient Medications  Medication Sig Dispense Refill  . [START ON 03/15/2021] amLODipine (NORVASC) 10 MG tablet Take 1 tablet (10 mg total) by mouth daily. 30 tablet 0  . gabapentin (NEURONTIN) 100 MG capsule Take 2 capsules (200 mg total) by mouth 3 (three) times daily. 180 capsule 0  . mirtazapine (REMERON) 15 MG tablet Take 1 tablet (15 mg total) by mouth at bedtime. 30 tablet 0  . pantoprazole (PROTONIX) 40 MG tablet Take 1 tablet (40 mg total)  by mouth daily. 30 tablet 0    PTA Medications: (Not in a hospital admission)   Musculoskeletal  Strength & Muscle Tone: within normal limits Gait & Station: normal Patient leans: N/A  Psychiatric Specialty Exam  Presentation  General Appearance: Appropriate for Environment; Disheveled  Eye Contact:Good  Speech:Clear and Coherent; Normal Rate  Speech Volume:Normal  Handedness:Right   Mood and Affect  Mood:Euthymic  Affect:Appropriate; Congruent   Thought Process  Thought Processes:Coherent  Descriptions of Associations:Intact  Orientation:Full (Time, Place and Person)  Thought Content:WDL  Diagnosis of Schizophrenia or Schizoaffective disorder in past: No    Hallucinations:Hallucinations: None  Ideas of Reference:None  Suicidal Thoughts:Suicidal Thoughts: No SI Passive Intent and/or Plan: Without Intent; With Plan (Patient stating he doesn't want to die but has had thoughts of cutting himself)  Homicidal Thoughts:Homicidal Thoughts: No   Sensorium  Memory:Immediate Good; Recent Good; Remote Good  Judgment:Good  Insight:Good   Executive Functions  Concentration:Good  Attention Span:Good  Recall:Good  Fund of Knowledge:Good  Language:Good   Psychomotor Activity  Psychomotor Activity:Psychomotor Activity: Normal   Assets  Assets:Communication Skills; Desire for Improvement; Transportation; Physical Health   Sleep  Sleep:Sleep: Good   Nutritional Assessment (For OBS and FBC admissions only) Has the patient had a weight loss or gain of 10 pounds or more in the last 3 months?: No Has the patient had a decrease in food intake/or appetite?: No Does the patient have dental problems?: No Does the patient have eating habits or behaviors that may be indicators of an eating disorder including binging or inducing vomiting?: No Has the patient recently lost weight without trying?: No Has the patient been eating poorly because of a decreased  appetite?: No Malnutrition Screening Tool Score: 0    Physical Exam  Physical Exam Vitals and nursing note reviewed.  Constitutional:      Appearance: He is well-developed.  HENT:     Head: Normocephalic.  Eyes:     Pupils: Pupils are equal, round, and reactive  to light.  Cardiovascular:     Rate and Rhythm: Normal rate.  Pulmonary:     Effort: Pulmonary effort is normal.  Musculoskeletal:        General: Normal range of motion.  Neurological:     Mental Status: He is alert and oriented to person, place, and time.    Review of Systems  Constitutional: Negative.   HENT: Negative.   Eyes: Negative.   Respiratory: Negative.   Cardiovascular: Negative.   Gastrointestinal: Negative.   Genitourinary: Negative.   Musculoskeletal: Negative.   Skin: Negative.   Neurological: Negative.   Endo/Heme/Allergies: Negative.   Psychiatric/Behavioral: Positive for depression and substance abuse.   Blood pressure (!) 156/119, pulse 80, temperature 98.6 F (37 C), temperature source Oral, resp. rate 18, SpO2 97 %. There is no height or weight on file to calculate BMI.  Demographic Factors:  Male, Caucasian, Low socioeconomic status and Living alone  Loss Factors: NA  Historical Factors: NA  Risk Reduction Factors:   Positive social support and Positive therapeutic relationship  Continued Clinical Symptoms:  Alcohol/Substance Abuse/Dependencies Previous Psychiatric Diagnoses and Treatments  Cognitive Features That Contribute To Risk:  None    Suicide Risk:  Mild:  Suicidal ideation of limited frequency, intensity, duration, and specificity.  There are no identifiable plans, no associated intent, mild dysphoria and related symptoms, good self-control (both objective and subjective assessment), few other risk factors, and identifiable protective factors, including available and accessible social support.  Plan Of Care/Follow-up recommendations:  Continue activity as  tolerated. Continue diet as recommended by your PCP. Ensure to keep all appointments with outpatient providers.Continue activity as tolerated. Continue diet as recommended by your PCP. Ensure to keep all appointments with outpatient providers.  Disposition: Discharge to [facility] [employer], FNP 03/14/2021, 12:47 PM

## 2021-04-06 ENCOUNTER — Telehealth: Payer: Self-pay

## 2021-04-06 NOTE — Telephone Encounter (Signed)
Patient has appt with Dr. Alvis Lemmings 6/14 at 2:30pm. Called patient 3x to inform that appt is virtual but the line was ringing busy. If patient does call please advise that appt is virtual and verify we have the correct phone number.

## 2021-04-11 ENCOUNTER — Other Ambulatory Visit: Payer: Self-pay

## 2021-04-11 ENCOUNTER — Ambulatory Visit: Payer: Self-pay | Attending: Family Medicine | Admitting: Family Medicine

## 2021-04-27 ENCOUNTER — Encounter (HOSPITAL_COMMUNITY): Payer: Self-pay

## 2021-04-27 ENCOUNTER — Other Ambulatory Visit: Payer: Self-pay

## 2021-04-27 ENCOUNTER — Emergency Department (HOSPITAL_COMMUNITY)
Admission: EM | Admit: 2021-04-27 | Discharge: 2021-04-28 | Disposition: A | Discharge status: Other Acute Inpatient Hospital | Payer: Self-pay | Attending: Emergency Medicine | Admitting: Emergency Medicine

## 2021-04-27 DIAGNOSIS — F17200 Nicotine dependence, unspecified, uncomplicated: Secondary | ICD-10-CM | POA: Insufficient documentation

## 2021-04-27 DIAGNOSIS — Y908 Blood alcohol level of 240 mg/100 ml or more: Secondary | ICD-10-CM | POA: Insufficient documentation

## 2021-04-27 DIAGNOSIS — Z20822 Contact with and (suspected) exposure to covid-19: Secondary | ICD-10-CM | POA: Insufficient documentation

## 2021-04-27 DIAGNOSIS — F321 Major depressive disorder, single episode, moderate: Secondary | ICD-10-CM | POA: Insufficient documentation

## 2021-04-27 DIAGNOSIS — E119 Type 2 diabetes mellitus without complications: Secondary | ICD-10-CM | POA: Insufficient documentation

## 2021-04-27 DIAGNOSIS — R45851 Suicidal ideations: Secondary | ICD-10-CM | POA: Insufficient documentation

## 2021-04-27 DIAGNOSIS — F32A Depression, unspecified: Secondary | ICD-10-CM

## 2021-04-27 LAB — CBC WITH DIFFERENTIAL/PLATELET
Abs Immature Granulocytes: 0.01 10*3/uL (ref 0.00–0.07)
Basophils Absolute: 0.1 10*3/uL (ref 0.0–0.1)
Basophils Relative: 2 %
Eosinophils Absolute: 0 10*3/uL (ref 0.0–0.5)
Eosinophils Relative: 1 %
HCT: 41.6 % (ref 39.0–52.0)
Hemoglobin: 14.4 g/dL (ref 13.0–17.0)
Immature Granulocytes: 0 %
Lymphocytes Relative: 22 %
Lymphs Abs: 1.2 10*3/uL (ref 0.7–4.0)
MCH: 34.4 pg — ABNORMAL HIGH (ref 26.0–34.0)
MCHC: 34.6 g/dL (ref 30.0–36.0)
MCV: 99.3 fL (ref 80.0–100.0)
Monocytes Absolute: 0.5 10*3/uL (ref 0.1–1.0)
Monocytes Relative: 10 %
Neutro Abs: 3.4 10*3/uL (ref 1.7–7.7)
Neutrophils Relative %: 65 %
Platelets: 124 10*3/uL — ABNORMAL LOW (ref 150–400)
RBC: 4.19 MIL/uL — ABNORMAL LOW (ref 4.22–5.81)
RDW: 14 % (ref 11.5–15.5)
WBC: 5.2 10*3/uL (ref 4.0–10.5)
nRBC: 0 % (ref 0.0–0.2)

## 2021-04-27 NOTE — ED Notes (Signed)
Pt changed out into burgundy scrubs. Pt wanded by security. Pt belongings in locker 28 in [place] [facility].

## 2021-04-27 NOTE — ED Triage Notes (Signed)
Pt states he has severe depression, pt states he has been going through a rough time for 2 years and it might be time for him to go. Pt states [place]. Pt SI. Pt states he is an alcoholic, which makes his depression worst, and tries to hurt himself. Pt denies HI.

## 2021-04-28 ENCOUNTER — Inpatient Hospital Stay (HOSPITAL_COMMUNITY)
Admission: AD | Admit: 2021-04-28 | Discharge: 2021-05-02 | DRG: 897 | Disposition: A | Discharge status: Home / Self Care | Payer: Federal, State, Local not specified - Other | Source: Intra-hospital | Attending: Emergency Medicine | Admitting: Emergency Medicine

## 2021-04-28 ENCOUNTER — Encounter (HOSPITAL_COMMUNITY): Payer: Self-pay | Admitting: Family

## 2021-04-28 DIAGNOSIS — F1024 Alcohol dependence with alcohol-induced mood disorder: Secondary | ICD-10-CM | POA: Diagnosis not present

## 2021-04-28 DIAGNOSIS — E119 Type 2 diabetes mellitus without complications: Secondary | ICD-10-CM | POA: Diagnosis present

## 2021-04-28 DIAGNOSIS — Z79899 Other long term (current) drug therapy: Secondary | ICD-10-CM | POA: Diagnosis not present

## 2021-04-28 DIAGNOSIS — F10239 Alcohol dependence with withdrawal, unspecified: Secondary | ICD-10-CM | POA: Diagnosis present

## 2021-04-28 DIAGNOSIS — Z5900 Homelessness unspecified: Secondary | ICD-10-CM | POA: Diagnosis not present

## 2021-04-28 DIAGNOSIS — F1994 Other psychoactive substance use, unspecified with psychoactive substance-induced mood disorder: Secondary | ICD-10-CM | POA: Diagnosis not present

## 2021-04-28 DIAGNOSIS — T1490XA Injury, unspecified, initial encounter: Secondary | ICD-10-CM

## 2021-04-28 DIAGNOSIS — Z20822 Contact with and (suspected) exposure to covid-19: Secondary | ICD-10-CM | POA: Diagnosis present

## 2021-04-28 DIAGNOSIS — R45851 Suicidal ideations: Secondary | ICD-10-CM | POA: Diagnosis present

## 2021-04-28 DIAGNOSIS — K219 Gastro-esophageal reflux disease without esophagitis: Secondary | ICD-10-CM | POA: Diagnosis present

## 2021-04-28 DIAGNOSIS — I10 Essential (primary) hypertension: Secondary | ICD-10-CM | POA: Diagnosis present

## 2021-04-28 DIAGNOSIS — F10229 Alcohol dependence with intoxication, unspecified: Secondary | ICD-10-CM | POA: Diagnosis not present

## 2021-04-28 LAB — COMPREHENSIVE METABOLIC PANEL
ALT: 50 U/L — ABNORMAL HIGH (ref 0–44)
AST: 67 U/L — ABNORMAL HIGH (ref 15–41)
Albumin: 3.2 g/dL — ABNORMAL LOW (ref 3.5–5.0)
Alkaline Phosphatase: 151 U/L — ABNORMAL HIGH (ref 38–126)
Anion gap: 11 (ref 5–15)
BUN: <5 mg/dL — ABNORMAL LOW (ref 6–20)
CO2: 24 mmol/L (ref 22–32)
Calcium: 8.4 mg/dL — ABNORMAL LOW (ref 8.9–10.3)
Chloride: 104 mmol/L (ref 98–111)
Creatinine, Ser: 0.71 mg/dL (ref 0.61–1.24)
GFR, Estimated: >60 mL/min (ref >60)
Glucose, Bld: 100 mg/dL — ABNORMAL HIGH (ref 70–99)
Potassium: 3.9 mmol/L (ref 3.5–5.1)
Sodium: 139 mmol/L (ref 135–145)
Total Bilirubin: 0.4 mg/dL (ref 0.3–1.2)
Total Protein: 7.8 g/dL (ref 6.5–8.1)

## 2021-04-28 LAB — CBG MONITORING, ED: Glucose-Capillary: 85 mg/dL (ref 70–99)

## 2021-04-28 LAB — RAPID URINE DRUG SCREEN, HOSP PERFORMED
Amphetamines: NOT DETECTED
Barbiturates: NOT DETECTED
Benzodiazepines: NOT DETECTED
Cocaine: NOT DETECTED
Opiates: NOT DETECTED
Tetrahydrocannabinol: NOT DETECTED

## 2021-04-28 LAB — RESP PANEL BY RT-PCR (FLU A&B, COVID) ARPGX2
Influenza A by PCR: NEGATIVE
Influenza B by PCR: NEGATIVE
SARS Coronavirus 2 by RT PCR: NEGATIVE

## 2021-04-28 LAB — SALICYLATE LEVEL: Salicylate Lvl: <7 mg/dL — ABNORMAL LOW (ref 7.0–30.0)

## 2021-04-28 LAB — ACETAMINOPHEN LEVEL: Acetaminophen (Tylenol), Serum: <10 ug/mL — ABNORMAL LOW (ref 10–30)

## 2021-04-28 LAB — ETHANOL: Alcohol, Ethyl (B): 291 mg/dL — ABNORMAL HIGH (ref <10)

## 2021-04-28 MED ORDER — ALUM & MAG HYDROXIDE-SIMETH 200-200-20 MG/5ML PO SUSP: 30.0000 mL | Freq: Four times a day (QID) | ORAL | Status: DC | PRN

## 2021-04-28 MED ORDER — GABAPENTIN 100 MG PO CAPS
200.0000 mg | ORAL_CAPSULE | Freq: Three times a day (TID) | ORAL | Status: DC
  Filled 2021-04-28 (×5): qty 2

## 2021-04-28 MED ORDER — AMLODIPINE BESYLATE 10 MG PO TABS
10.0000 mg | ORAL_TABLET | Freq: Every day | ORAL | Status: DC
  Administered 2021-04-29 – 2021-05-02 (×4): 10 mg via ORAL
  Filled 2021-04-28 (×5): qty 1
  Filled 2021-04-28: qty 7
  Filled 2021-04-28: qty 1

## 2021-04-28 MED ORDER — LORAZEPAM 1 MG PO TABS
2.0000 mg | ORAL_TABLET | Freq: Four times a day (QID) | ORAL | Status: DC | PRN
  Administered 2021-04-29: 2 mg via ORAL
  Filled 2021-04-28: qty 2

## 2021-04-28 MED ORDER — LORAZEPAM 1 MG PO TABS
2.0000 mg | ORAL_TABLET | Freq: Once | ORAL | Status: AC
  Administered 2021-04-28: 2 mg via ORAL
  Filled 2021-04-28: qty 2

## 2021-04-28 MED ORDER — THIAMINE HCL 100 MG PO TABS
100.0000 mg | ORAL_TABLET | Freq: Every day | ORAL | Status: DC
  Administered 2021-04-29 – 2021-05-02 (×4): 100 mg via ORAL
  Filled 2021-04-28 (×5): qty 1

## 2021-04-28 MED ORDER — LORAZEPAM 1 MG PO TABS
0.0000 mg | ORAL_TABLET | Freq: Four times a day (QID) | ORAL | Status: DC
  Administered 2021-04-28: 1 mg via ORAL
  Filled 2021-04-28: qty 1

## 2021-04-28 MED ORDER — LORAZEPAM 1 MG PO TABS: 0.0000 mg | ORAL_TABLET | Freq: Two times a day (BID) | ORAL | Status: DC

## 2021-04-28 MED ORDER — LORAZEPAM 1 MG PO TABS
1.0000 mg | ORAL_TABLET | Freq: Four times a day (QID) | ORAL | Status: DC | PRN
  Administered 2021-04-28: 1 mg via ORAL
  Filled 2021-04-28: qty 1

## 2021-04-28 MED ORDER — THIAMINE HCL 100 MG PO TABS
100.0000 mg | ORAL_TABLET | Freq: Every day | ORAL | Status: DC
  Administered 2021-04-28: 100 mg via ORAL
  Filled 2021-04-28: qty 1

## 2021-04-28 MED ORDER — PANTOPRAZOLE SODIUM 40 MG PO TBEC
40.0000 mg | DELAYED_RELEASE_TABLET | Freq: Every day | ORAL | Status: DC
  Administered 2021-04-28 – 2021-05-02 (×5): 40 mg via ORAL
  Filled 2021-04-28 (×6): qty 1
  Filled 2021-04-28: qty 7
  Filled 2021-04-28: qty 1

## 2021-04-28 MED ORDER — FOLIC ACID 1 MG PO TABS
1.0000 mg | ORAL_TABLET | Freq: Every day | ORAL | Status: DC
  Administered 2021-04-29 – 2021-05-02 (×4): 1 mg via ORAL
  Filled 2021-04-28 (×6): qty 1

## 2021-04-28 MED ORDER — THIAMINE HCL 100 MG/ML IJ SOLN: 100.0000 mg | Freq: Every day | INTRAMUSCULAR | Status: DC

## 2021-04-28 MED ORDER — LORAZEPAM 2 MG/ML IJ SOLN: 0.0000 mg | Freq: Four times a day (QID) | INTRAMUSCULAR | Status: DC

## 2021-04-28 MED ORDER — NICOTINE 21 MG/24HR TD PT24
21.0000 mg | MEDICATED_PATCH | Freq: Once | TRANSDERMAL | Status: DC
  Administered 2021-04-28: 21 mg via TRANSDERMAL
  Filled 2021-04-28: qty 1

## 2021-04-28 MED ORDER — LORAZEPAM 2 MG/ML IJ SOLN
0.0000 mg | Freq: Four times a day (QID) | INTRAMUSCULAR | Status: DC
Start: 2021-04-28 — End: 2021-04-28

## 2021-04-28 MED ORDER — GABAPENTIN 300 MG PO CAPS
300.0000 mg | ORAL_CAPSULE | Freq: Three times a day (TID) | ORAL | Status: DC
Start: 2021-04-28 — End: 2021-05-02
  Administered 2021-04-28 – 2021-05-02 (×12): 300 mg via ORAL
  Filled 2021-04-28: qty 21
  Filled 2021-04-28 (×11): qty 1
  Filled 2021-04-28: qty 21
  Filled 2021-04-28 (×4): qty 1

## 2021-04-28 MED ORDER — ONDANSETRON HCL 4 MG PO TABS
4.0000 mg | ORAL_TABLET | Freq: Three times a day (TID) | ORAL | Status: DC | PRN
  Administered 2021-04-28 – 2021-04-29 (×2): 4 mg via ORAL
  Filled 2021-04-28 (×2): qty 1

## 2021-04-28 MED ORDER — NICOTINE 21 MG/24HR TD PT24
21.0000 mg | MEDICATED_PATCH | Freq: Once | TRANSDERMAL | Status: DC
  Filled 2021-04-28: qty 1

## 2021-04-28 MED ORDER — ONDANSETRON HCL 4 MG PO TABS: 4.0000 mg | ORAL_TABLET | Freq: Three times a day (TID) | ORAL | Status: DC | PRN

## 2021-04-28 MED ORDER — MIRTAZAPINE 15 MG PO TABS
15.0000 mg | ORAL_TABLET | Freq: Every day | ORAL | Status: DC
  Administered 2021-04-28 – 2021-05-01 (×4): 15 mg via ORAL
  Filled 2021-04-28 (×2): qty 1
  Filled 2021-04-28: qty 7
  Filled 2021-04-28 (×3): qty 1

## 2021-04-28 MED ORDER — AMLODIPINE BESYLATE 5 MG PO TABS
10.0000 mg | ORAL_TABLET | Freq: Once | ORAL | Status: AC
  Administered 2021-04-28: 10 mg via ORAL
  Filled 2021-04-28: qty 2

## 2021-04-28 MED ORDER — LEVETIRACETAM 500 MG PO TABS
500.0000 mg | ORAL_TABLET | Freq: Two times a day (BID) | ORAL | Status: DC
  Administered 2021-04-28 – 2021-05-01 (×6): 500 mg via ORAL
  Filled 2021-04-28 (×12): qty 1

## 2021-04-28 MED ORDER — LORAZEPAM 2 MG/ML IJ SOLN: 0.0000 mg | Freq: Two times a day (BID) | INTRAMUSCULAR | Status: DC

## 2021-04-28 MED ORDER — ACETAMINOPHEN 325 MG PO TABS: 650.0000 mg | ORAL_TABLET | ORAL | Status: DC | PRN

## 2021-04-28 MED ORDER — MAGNESIUM HYDROXIDE 400 MG/5ML PO SUSP: 30.0000 mL | Freq: Every day | ORAL | Status: DC | PRN

## 2021-04-28 MED ORDER — LORAZEPAM 1 MG PO TABS: 0.0000 mg | ORAL_TABLET | Freq: Four times a day (QID) | ORAL | Status: DC

## 2021-04-28 MED ORDER — ACETAMINOPHEN 325 MG PO TABS
650.0000 mg | ORAL_TABLET | Freq: Four times a day (QID) | ORAL | Status: DC | PRN
  Administered 2021-04-28 – 2021-05-01 (×2): 650 mg via ORAL
  Filled 2021-04-28 (×2): qty 2

## 2021-04-28 NOTE — Progress Notes (Signed)
Pt transferred to [facility] from [facility] where he presented for worsening depression and suicidal ideations "It maybe time for me to go" related to etoh abuse per nursing report. Pt did confirm living in the woods X 2 months since his d/c from [facility]. Per pt events leading to admission "I did not take my medications when I left here. I followed this guy from here and we were supposed to live together at a half way house but he dropped me off at [place] and went to [facility] for Rehab. I just been drinking about 18 bottles of beer every day and I start when I wake up". 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 Pt reports he's been drinking heavily over 20 years but started drinking in his early teens. Presents irritable, very nervous, tremulous; unable to hold his cup with extreme difficulties signing admission document due to shakes. Endorsed passive SI, verbally contracts for safety. Denies HI, AVH and pain at this time. Pt reported recent fall in the woods "I was drunk at the time, I'm just tired of all this". Skin assessment done and belongings searched per protocol. Items deemed contraband secured in locker. Scabs noted on right knee and lateral aspect of left leg "That happened when I fell". Pt ambulatory to milieu with an unsteady gait due to weakness and shakes in both upper and lower extremities. Unit orientation done, routines discussed, admission documents signed and care plan reviewed with pt. Fall precaution and Q 15 minutes safety checks initiated without incident thus far. Emotional support and encouragement offered to pt. Fluids given and tolerated well. Vitals elevated will recheck as pt also received Norvasc 5 mg PO just before transport.

## 2021-04-28 NOTE — ED Notes (Signed)
TTS in progress 

## 2021-04-28 NOTE — ED Provider Notes (Signed)
Emergency Medicine Observation Re-evaluation Note  Francisco Houston is a 45 y.o. male, seen on rounds today.  Pt initially presented to the ED for complaints of Suicidal and Depression Currently, the patient is resting and now is awaiting admission to behavioral health.  He is concerned he is going into alcohol withdrawal..  Physical Exam  BP 118/80 (BP Location: Left Arm)   Pulse 69   Temp 98.5 F (36.9 C) (Oral)   Resp 18   SpO2 97%  Physical Exam General: no acute distress Lungs: normal effort Psych: suicidal  ED Course / MDM  EKG:   I have reviewed the labs performed to date as well as medications administered while in observation.  Recent changes in the last 24 hours include CIWA protocol meds and prn meds being ordered.  Plan  Current plan is for admission to behavioral health. Ideally voluntarily, but would commit if he decides to leave. Patient is not under full IVC at this time.   Pricilla Loveless, MD 04/28/21 1040

## 2021-04-28 NOTE — BH Assessment (Signed)
BHH Assessment Progress Note   Per Caryn Bee, NP, this pt requires psychiatric hospitalization at this time.  [name], RN, [facility] has assigned pt to [facility] Rm 400-1 to the service of Landry Mellow, MD.  [facility] will be ready to receive pt at 13:00.  Pt has signed Voluntary Admission and Consent for Treatment, as well as Consent to Release Information to no one, and signed forms have been faxed to [facility].  EDP Pricilla Loveless, MD and pt's nurse, Addison Naegeli, have been notified, and Addison Naegeli agrees to send original paperwork along with pt via Safe Transport, and to call report to [phone].  Doylene Canning, [place] [facility] Coordinator [phone]
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
## 2021-04-28 NOTE — ED Provider Notes (Signed)
[facility]-EMERGENCY DEPT Provider Note   CSN: [id number] Arrival date & time: 04/27/21  2107     History Chief Complaint  Patient presents with   Suicidal   Depression    Francisco Houston is a 45 y.o. male with a history of PTSD, homelessness, diabetes mellitus, alcohol use disorder, cocaine use disorder, depression who presents to the emergency department who presents to the emergency department with a chief complaint of depressed mood.  The patient reports that he has been more depressed for the last 2 years and thinks that it might be time for him to die.  "I just don't want to be here." [place].  He reports that he is an alcoholic, which worsens his depression and makes him want to hurt himself.  He denies HI.  He denies having a plan today, but states that the last time that he was brought to the ED he had a gun up to his head.  He denies fever, chills, shortness of breath chest pain, rash, or other physical complaints at this time  He reports that he is a pack-a-day smoker.  He drinks alcohol daily.  Reports that he drinks as much beer as he can get his hands on, which is typically about a case a day.  He has not been taking his home medications.  The history is provided by the patient and medical records. No language interpreter was used.      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 No pertinent surgical history.     History reviewed. No pertinent family history.  Social History   Tobacco Use   Smoking status: Some Days    Pack years: 0.00   Smokeless tobacco: Never  Substance Use Topics   Alcohol use: Yes    Comment: heavily   Drug use: Yes    Types: Cocaine    Home Medications Prior to Admission medications   Medication Sig Start Date End Date Taking? Authorizing Provider  amLODipine (NORVASC) 10 MG tablet Take 1 tablet (10 mg total) by mouth daily. 03/15/21   Money, Gerlene Burdock, FNP  gabapentin (NEURONTIN) 100 MG capsule Take 2 capsules (200 mg total) by mouth 3 (three) times daily. 03/14/21   Money, Gerlene Burdock, FNP  mirtazapine (REMERON) 15 MG tablet Take 1 tablet (15 mg total) by mouth at bedtime. 03/14/21   Money, Gerlene Burdock, FNP  pantoprazole (PROTONIX) 40 MG tablet Take 1 tablet (40 mg total) by mouth daily. 03/14/21   Money, Gerlene Burdock, FNP    Allergies    Patient has no known allergies.  Review of Systems   Review of Systems  Constitutional:  Negative for appetite change and fever.  HENT:  Negative for congestion and sore throat.   Respiratory:  Negative for shortness of breath.   Cardiovascular:  Negative for chest pain.  Gastrointestinal:  Negative for abdominal pain, constipation, diarrhea, nausea and vomiting.  Genitourinary:  Negative for dysuria.  Musculoskeletal:  Negative for back pain.  Skin:  Negative for rash.  Allergic/Immunologic: Negative for immunocompromised state.  Neurological:  Negative for dizziness, seizures, syncope, weakness, numbness and headaches.  Psychiatric/Behavioral:  Positive for dysphoric mood and suicidal ideas. Negative for agitation, confusion and self-injury. The patient is not nervous/anxious.    Physical Exam Updated Vital Signs BP (!) 133/94   Pulse 83   Temp 98.5 F (36.9 C) (Oral)   Resp 18   SpO2 99%   Physical Exam Vitals and nursing note reviewed.  Constitutional:      Appearance: He is  well-developed.     Comments: Disheveled  HENT:     Head: Normocephalic.  Eyes:     Conjunctiva/sclera: Conjunctivae normal.  Cardiovascular:     Rate and Rhythm: Normal rate and regular rhythm.     Pulses: Normal pulses.     Heart sounds: Normal heart sounds. No murmur heard.   No friction rub. No gallop.  Pulmonary:     Effort: Pulmonary effort is normal. No respiratory distress.     Breath sounds: No stridor. No wheezing, rhonchi or rales.  Chest:     Chest wall: No tenderness.  Abdominal:     General: There is no distension.     Palpations: Abdomen is soft. There is no mass.     Tenderness: There is no abdominal tenderness. There is no right CVA tenderness, left CVA tenderness, guarding or rebound.     Hernia: No hernia is present.  Musculoskeletal:     Cervical back: Neck supple.  Skin:    General: Skin is warm and dry.     Coloration: Skin is not jaundiced.  Neurological:     Mental Status: He is alert.     Comments: Alert and oriented x3.  No tremors at rest.   Psychiatric:        Speech: Speech normal.        Behavior: Behavior normal.        Thought Content: Thought content includes suicidal ideation. Thought content does not include homicidal ideation. Thought content does not include homicidal or suicidal plan.    ED Results / Procedures / Treatments   Labs (all labs ordered are listed, but only abnormal results are displayed) Labs Reviewed  COMPREHENSIVE METABOLIC PANEL - Abnormal; Notable for the following components:      Result Value   Glucose, Bld 100 (*)    BUN <5 (*)    Calcium 8.4 (*)    Albumin 3.2 (*)    AST 67 (*)    ALT 50 (*)    Alkaline Phosphatase 151 (*)    All other components within normal limits  ETHANOL - Abnormal; Notable for the following components:   Alcohol, Ethyl (B) 291 (*)    All other components within normal limits  CBC WITH DIFFERENTIAL/PLATELET - Abnormal; Notable for the following components:   RBC 4.19 (*)    MCH 34.4  (*)    Platelets 124 (*)    All other components within normal limits  ACETAMINOPHEN LEVEL - Abnormal; Notable for the following components:   Acetaminophen (Tylenol), Serum <10 (*)    All other components within normal limits  SALICYLATE LEVEL - Abnormal; Notable for the following components:   Salicylate Lvl <7.0 (*)    All other components within normal limits  RESP PANEL BY RT-PCR (FLU A&B, COVID) ARPGX2  RAPID URINE DRUG SCREEN, HOSP PERFORMED    EKG  None  Radiology No results found.  Procedures Procedures   Medications Ordered in ED Medications  acetaminophen (TYLENOL) tablet 650 mg (has no administration in time range)  ondansetron (ZOFRAN) tablet 4 mg (has no administration in time range)  alum & mag hydroxide-simeth (MAALOX/MYLANTA) 200-200-20 MG/5ML suspension 30 mL (has no administration in time range)  nicotine (NICODERM CQ - dosed in mg/24 hours) patch 21 mg (has no administration in time range)  LORazepam (ATIVAN) injection 0-4 mg (has no administration in time range)    Or  LORazepam (ATIVAN) tablet 0-4 mg (has no administration in time range)  LORazepam (ATIVAN) injection 0-4 mg (has no administration in time range)    Or  LORazepam (ATIVAN) tablet 0-4 mg (has no administration in time range)  thiamine tablet 100 mg (has no administration in time range)    Or  thiamine (B-1) injection 100 mg (has no administration in time range)    ED Course  I have reviewed the triage vital signs and the nursing notes.  Pertinent labs & imaging results that were available during my care of the patient were reviewed by me and considered in my medical decision making (see chart for details).    MDM Rules/Calculators/A&P                          45 year old male with a history of PTSD, homelessness, diabetes mellitus, alcohol use disorder, cocaine use disorder, depression who presents the emergency department with depressed mood and suicidal thoughts.  He denies HI or  auditory visual hallucinations.  He is voluntary.  No indication for IVC at this time.  Vital signs are stable.  Labs and imaging of been reviewed and independently interpreted by me.  Ethanol level is elevated.  Transaminase levels and alkaline phosphatase are elevated, likely secondary to heavy, constant alcohol use.  Suspect this is also the etiology of his thrombocytopenia.  Hemoglobin is stable.  No hypoglycemia.  UDS is unremarkable.  Patient's exam is benign.  Pt medically cleared at this time. Psych hold orders and home med orders not placed as patient has not been taking his home medications.  Started on CIWA protocol.  He does not actively appear in withdrawal at this time and vital signs are stable.  TTS consulted and overnight observation recommended; please see psych team notes for further documentation of care/dispo. Pt stable at time of med clearance.    Final Clinical Impression(s) / ED Diagnoses Final diagnoses:  None    Rx / DC Orders ED Discharge Orders     None        Barkley Boards, PA-C 04/28/21 0419    Palumbo, April, MD 04/28/21 0451

## 2021-04-28 NOTE — ED Notes (Signed)
Patient received breakfast tray 
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
## 2021-04-28 NOTE — Progress Notes (Signed)
04/28/2021  1242  Called report to [facility] [phone]. Report given to [facility].

## 2021-04-28 NOTE — Tx Team (Addendum)
Initial Treatment Plan 04/28/2021 6:02 PM [name] RAQ:[id number]    PATIENT STRESSORS: Financial difficulties Marital or family conflict Medication change or noncompliance Occupational concerns Substance abuse   PATIENT STRENGTHS: Average or above average intelligence Capable of independent living Communication skills Motivation for treatment/growth   PATIENT IDENTIFIED PROBLEMS: Alterations in mood "I'm very depressed & anxious. My family just abandoned me".    Substance Abuse "I drink about 18 beers a day. I panhandle to support my habit. Been drinking over 20 years".    Suicide risk "I feel suicidal, I can't live like this anymore. I want to be able to drink like when I'm grilling but not like I drink now".    Homelessness "I've been living in the woods for about 2 months now".         DISCHARGE CRITERIA:  Adequate post-discharge living arrangements Improved stabilization in mood, thinking, and/or behavior Verbal commitment to aftercare and medication compliance  PRELIMINARY DISCHARGE PLAN: Attend 12-step recovery group Outpatient therapy Placement in alternative living arrangements  PATIENT/FAMILY INVOLVEMENT: This treatment plan has been presented to and reviewed with the patient, Francisco Houston. The patient have been given the opportunity to ask questions and make suggestions.  Sherryl Manges, RN 04/28/2021, 6:02 PM
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
## 2021-04-29 DIAGNOSIS — F1994 Other psychoactive substance use, unspecified with psychoactive substance-induced mood disorder: Secondary | ICD-10-CM

## 2021-04-29 MED ORDER — LORAZEPAM 1 MG PO TABS
1.0000 mg | ORAL_TABLET | Freq: Four times a day (QID) | ORAL | Status: DC | PRN
  Administered 2021-04-29 (×2): 1 mg via ORAL
  Filled 2021-04-29 (×2): qty 1

## 2021-04-29 MED ORDER — NICOTINE POLACRILEX 2 MG MT GUM
2.0000 mg | CHEWING_GUM | OROMUCOSAL | Status: DC | PRN
  Administered 2021-04-29 – 2021-05-02 (×12): 2 mg via ORAL
  Filled 2021-04-29 (×13): qty 1

## 2021-04-29 NOTE — BHH Suicide Risk Assessment (Signed)
B[facility] INPATIENT:  Family/Significant Other Suicide Prevention Education  Suicide Prevention Education:  Patient Refusal for Family/Significant Other Suicide Prevention Education: The patient Francisco Houston has refused to provide written consent for family/significant other to be provided Family/Significant Other Suicide Prevention Education during admission and/or prior to discharge.  Physician notified.  SPE completed with pt, as pt refused to consent to family contact. SPI pamphlet provided to pt and pt was encouraged to share information with support network, ask questions, and talk about any concerns relating to SPE. Pt denies access to guns/firearms and verbalized understanding of information provided. Mobile Crisis information also provided to pt.    Rona Ravens, MSW, LCSW 04/29/2021, 12:17 PM

## 2021-04-29 NOTE — Progress Notes (Signed)
Pt did not attend goals group today. 

## 2021-04-29 NOTE — Progress Notes (Signed)
Pt slept through lunch and got up around 1400.  Pt said, "I feel much better. Thanks for saving a lunch for me, I just ate it a few minutes ago."  Pt requesting Gatorade and Nicotine gum.  RN will continue to monitor and provide support as needed.

## 2021-04-29 NOTE — Progress Notes (Signed)
Pt c/o withdrawal symptoms and scored a 10 on his CIWA at 2115 for which he was administered 1 mg of ativan po at 2117. Some of his stressors are being homeless and being unemployed. Pt has been cooperative on the unit. He attended and participated in group earlier tonight. Pt denies SI/HI and VH. He does endorse AH of lights. Active listening, reassurance, and support provided. Medications administered as ordered by provider. Q 15 min safety checks continue. Pt's safety has been maintained.   04/29/21 2117  Psych Admission Type (Psych Patients Only)  Admission Status Voluntary  Psychosocial Assessment  Patient Complaints Anxiety;Depression;Worrying;Substance abuse  Eye Contact Fair  Facial Expression Anxious;Sullen;Sad;Worried  Affect Anxious;Appropriate to circumstance;Depressed;Sad;Sullen  Speech Logical/coherent  Interaction Assertive  Motor Activity Fidgety;Tremors;Unsteady  Appearance/Hygiene Improved  Behavior Characteristics Cooperative;Appropriate to situation;Anxious;Fidgety  Mood Depressed;Anxious;Sad;Sullen  Thought Process  Coherency WDL  Content WDL  Delusions Controlled;None reported or observed  Perception Hallucinations  Hallucination Auditory  Judgment Impaired  Confusion None  Danger to Self  Current suicidal ideation? Denies  Self-Injurious Behavior No self-injurious ideation or behavior indicators observed or expressed   Agreement Not to Harm Self Yes  Description of Agreement Pt verbally contracts for safety  Danger to Others  Danger to Others None reported or observed
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
## 2021-04-29 NOTE — Progress Notes (Signed)
     04/29/21 1000  Psych Admission Type (Psych Patients Only)  Admission Status Voluntary  Psychosocial Assessment  Patient Complaints Substance abuse;Anxiety;Depression  Eye Contact Fair  Facial Expression Anxious;Sullen  Affect Anxious;Irritable  Speech Logical/coherent  Interaction Assertive  Motor Activity Tremors;Fidgety;Restless  Appearance/Hygiene Disheveled;In scrubs  Behavior Characteristics Anxious  Mood Anxious  Thought Process  Coherency WDL  Content Preoccupation  Delusions None reported or observed  Perception WDL  Hallucination None reported or observed (from detox - sees dots and shadows)  Judgment Impaired  Confusion None  Danger to Self  Current suicidal ideation? Denies  Agreement Not to Harm Self Yes  Description of Agreement Pt verbally contracts for safety  Danger to Others  Danger to Others None reported or observed

## 2021-04-29 NOTE — Progress Notes (Signed)
Pt seen in dayroom this morning having VS assessed. Pt endorsing tremors, tactile disturbances (itching, tingling in feet), anxiety 8/10, mild nausea and tiredness. Pt alert and oriented. CIWA=11. Pt given Ativan 2 mg per MAR.

## 2021-04-29 NOTE — BHH Group Notes (Signed)
  [facility]/BM[facility] LCSW Group Therapy Note  Date/Time:  04/29/2021 10:00AM-11:00AM  Type of Therapy and Topic:  Group Therapy:  Self-Care after Hospitalization  Participation Level:  Did Not Attend   Description of Group This process group involved patients discussing how they plan to take care of themselves in a better manner when they get home from the hospital.  The group started with patients listing one healthy and one unhealthy way they took care of themselves prior to hospitalization.  A discussion ensued about the differences in healthy and unhealthy coping skills.  Group members shared ideas about making changes when they return home so that they can stay well and in recovery.  The white board was used to list ideas so that patients can continue to see these ideas throughout the day.  Therapeutic Goals Patient will identify and describe one healthy and one unhealthy coping technique used prior to hospitalization Patient will participate in generating ideas about healthy self-care options when they return to the community Patients will be supportive of one another and receive said support from others Patient will identify one healthy self-care activity to add to his/her post-hospitalization life that can help in recovery  Summary of Patient Progress:  The patient was invited to group, did not attend.   Therapeutic Modalities Brief Solution-Focused Therapy Motivational Interviewing Psychoeducation   Larkin Alfred Grossman-Orr, LCSW 04/29/2021, 12:00pm    

## 2021-04-29 NOTE — Progress Notes (Signed)
[facility] MD Progress Note  04/29/2021 11:15 AM Francisco Houston  MRN:  [id number] Subjective: Patient is a 45 year old male with a past psychiatric history significant for substance-induced mood disorder, alcohol dependence who presented on 04/27/2021 to the [facility] emergency department with suicidal ideation and request for detoxification.  Objective: Patient is seen and examined.  Patient is a 45 year old male with the above-stated past psychiatric history who is seen in follow-up.  He is essentially unchanged from yesterday.  He stated that after his discharge from the hospital on his last hospitalization he had gone to stay with a friend.  The plan was to go to an [place] house.  His friend decided to go to rehab, and when his friend went to rehab he dropped him off at the back of a [employer].  He stated he has been staying behind that [employer] since that date.  When asked about why he did not present to one of the homeless shelters or go to the [facility] he was unable to explain that.  We discussed his alcohol intake, and I asked whether or not he had been taking his medications and he denied that.  He stated he did not take any of his medications after discharge.  I asked him if he had enough money to pay for alcohol why he did not use that for medications, and again he was unable to answer that question.  He remains homeless.  Yesterday he had a significant degree of tremulousness, and he was placed on Keppra 500 mg p.o. twice daily empirically for alcohol withdrawal seizure prevention.  Additionally his Ativan dosage was increased to 2 mg p.o. every 6 hours as needed a CIWA greater than 10.  He does look comfortable this morning.  He is mildly tachycardic with a rate of 105.  Blood pressure stable.  Pulse oximetry on room air is 98%.  His most recent CIWA was 11.  Unfortunately sleep was not recorded.  No new laboratories.  Principal Problem: <principal problem not specified> Diagnosis: Active  Problems:   Substance induced mood disorder (HCC)  Total Time spent with patient: 20 minutes  Past Psychiatric History: See admission H&P  Past Medical History:  Past Medical History:  Diagnosis Date   Alcohol intoxication (HCC)    Diabetes mellitus without complication (HCC)    Homelessness    PTSD (post-traumatic stress disorder)    History reviewed. No pertinent surgical history. Family History: History reviewed. No pertinent family history. Family Psychiatric  History: See admission H&P Social History:  Social History   Substance and Sexual Activity  Alcohol Use Yes   Comment: heavily     Social History   Substance and Sexual Activity  Drug Use Yes   Types: Cocaine    Social History   Socioeconomic History   Marital status: Divorced    Spouse name: Not on file   Number of children: Not on file   Years of education: Not on file   Highest education level: Not on file  Occupational History   Not on file  Tobacco Use   Smoking status: Some Days    Pack years: 0.00   Smokeless tobacco: Never  Substance and Sexual Activity   Alcohol use: Yes    Comment: heavily   Drug use: Yes    Types: Cocaine   Sexual activity: Not on file  Other Topics Concern   Not on file  Social History Narrative   ** Merged History Encounter **  Social Determinants of Health   Financial Resource Strain: Not on file  Food Insecurity: Not on file  Transportation Needs: Not on file  Physical Activity: Not on file  Stress: Not on file  Social Connections: Not on file   Additional Social History:                         Sleep: Fair  Appetite:  Fair  Current Medications: Current Facility-Administered Medications  Medication Dose Route Frequency Provider Last Rate Last Admin   acetaminophen (TYLENOL) tablet 650 mg  650 mg Oral Q6H PRN Bobbitt, Shalon E, NP   650 mg at 04/28/21 2122   amLODipine (NORVASC) tablet 10 mg  10 mg Oral Daily Antonieta Pert, MD   10  mg at 04/29/21 0940   folic acid (FOLVITE) tablet 1 mg  1 mg Oral Daily Antonieta Pert, MD   1 mg at 04/29/21 2563   gabapentin (NEURONTIN) capsule 300 mg  300 mg Oral TID Antonieta Pert, MD   300 mg at 04/29/21 8937   levETIRAcetam (KEPPRA) tablet 500 mg  500 mg Oral BID Antonieta Pert, MD   500 mg at 04/29/21 3428   LORazepam (ATIVAN) tablet 2 mg  2 mg Oral Q6H PRN Antonieta Pert, MD   2 mg at 04/29/21 7681   magnesium hydroxide (MILK OF MAGNESIA) suspension 30 mL  30 mL Oral Daily PRN Antonieta Pert, MD       mirtazapine (REMERON) tablet 15 mg  15 mg Oral QHS Antonieta Pert, MD   15 mg at 04/28/21 2122   nicotine polacrilex (NICORETTE) gum 2 mg  2 mg Oral PRN Bobbitt, Ysidro Evert E, NP   2 mg at 04/29/21 0624   ondansetron (ZOFRAN) tablet 4 mg  4 mg Oral Q8H PRN Antonieta Pert, MD   4 mg at 04/28/21 1428   pantoprazole (PROTONIX) EC tablet 40 mg  40 mg Oral Daily Antonieta Pert, MD   40 mg at 04/29/21 1572   thiamine tablet 100 mg  100 mg Oral Daily Antonieta Pert, MD   100 mg at 04/29/21 6203   Or   thiamine (B-1) injection 100 mg  100 mg Intravenous Daily Antonieta Pert, MD        Lab Results:  Results for orders placed or performed during the hospital encounter of 04/27/21 (from the past 48 hour(s))  Resp Panel by RT-PCR (Flu A&B, Covid) Nasopharyngeal Swab     Status: None   Collection Time: 04/27/21 11:30 PM   Specimen: Nasopharyngeal Swab; Nasopharyngeal(NP) swabs in vial transport medium  Result Value Ref Range   SARS Coronavirus 2 by RT PCR NEGATIVE NEGATIVE    Comment: (NOTE) SARS-CoV-2 target nucleic acids are NOT DETECTED.  The SARS-CoV-2 RNA is generally detectable in upper respiratory specimens during the acute phase of infection. The lowest concentration of SARS-CoV-2 viral copies this assay can detect is 138 copies/mL. A negative result does not preclude SARS-Cov-2 infection and should not be used as the sole basis for treatment  or other patient management decisions. A negative result may occur with  improper specimen collection/handling, submission of specimen other than nasopharyngeal swab, presence of viral mutation(s) within the areas targeted by this assay, and inadequate number of viral copies(<138 copies/mL). A negative result must be combined with clinical observations, patient history, and epidemiological information. The expected result is Negative.  Fact Sheet for Patients:  BloggerCourse.com  Fact Sheet for Healthcare Providers:  SeriousBroker.it  This test is no t yet approved or cleared by the [place] FDA and  has been authorized for detection and/or diagnosis of SARS-CoV-2 by FDA under an Emergency Use Authorization (EUA). This EUA will remain  in effect (meaning this test can be used) for the duration of the COVID-19 declaration under Section 564(b)(1) of the Act, 21 U.S.C.section 360bbb-3(b)(1), unless the authorization is terminated  or revoked sooner.       Influenza A by PCR NEGATIVE NEGATIVE   Influenza B by PCR NEGATIVE NEGATIVE    Comment: (NOTE) The Xpert Xpress SARS-CoV-2/FLU/RSV plus assay is intended as an aid in the diagnosis of influenza from Nasopharyngeal swab specimens and should not be used as a sole basis for treatment. Nasal washings and aspirates are unacceptable for Xpert Xpress SARS-CoV-2/FLU/RSV testing.  Fact Sheet for Patients: BloggerCourse.com  Fact Sheet for Healthcare Providers: SeriousBroker.it  This test is not yet approved or cleared by the [place] FDA and has been authorized for detection and/or diagnosis of SARS-CoV-2 by FDA under an Emergency Use Authorization (EUA). This EUA will remain in effect (meaning this test can be used) for the duration of the COVID-19 declaration under Section 564(b)(1) of the Act, 21 U.S.C. section  360bbb-3(b)(1), unless the authorization is terminated or revoked.  Performed at [facility], [address] [place], [place] [address]   Comprehensive metabolic panel     Status: Abnormal   Collection Time: 04/27/21 11:30 PM  Result Value Ref Range   Sodium 139 135 - 145 mmol/L   Potassium 3.9 3.5 - 5.1 mmol/L   Chloride 104 98 - 111 mmol/L   CO2 24 22 - 32 mmol/L   Glucose, Bld 100 (H) 70 - 99 mg/dL    Comment: Glucose reference range applies only to samples taken after fasting for at least 8 hours.   BUN <5 (L) 6 - 20 mg/dL   Creatinine, Ser 5.28 0.61 - 1.24 mg/dL   Calcium 8.4 (L) 8.9 - 10.3 mg/dL   Total Protein 7.8 6.5 - 8.1 g/dL   Albumin 3.2 (L) 3.5 - 5.0 g/dL   AST 67 (H) 15 - 41 U/L   ALT 50 (H) 0 - 44 U/L   Alkaline Phosphatase 151 (H) 38 - 126 U/L   Total Bilirubin 0.4 0.3 - 1.2 mg/dL   GFR, Estimated >41 >32 mL/min    Comment: (NOTE) Calculated using the CKD-EPI Creatinine Equation (2021)    Anion gap 11 5 - 15    Comment: Performed at [facility], [address], [place], [place] [address]  Ethanol     Status: Abnormal   Collection Time: 04/27/21 11:30 PM  Result Value Ref Range   Alcohol, Ethyl (B) 291 (H) <10 mg/dL    Comment: (NOTE) Lowest detectable limit for serum alcohol is 10 mg/dL.  For medical purposes only. Performed at [facility], [address] [place], [place] [address]   Urine rapid drug screen (hosp performed)     Status: None   Collection Time: 04/27/21 11:30 PM  Result Value Ref Range   Opiates NONE DETECTED NONE DETECTED   Cocaine NONE DETECTED NONE DETECTED   Benzodiazepines NONE DETECTED NONE DETECTED   Amphetamines NONE DETECTED NONE DETECTED   Tetrahydrocannabinol NONE DETECTED NONE DETECTED   Barbiturates NONE DETECTED NONE DETECTED    Comment: (NOTE) DRUG SCREEN FOR MEDICAL PURPOSES ONLY.  IF CONFIRMATION IS NEEDED FOR ANY  PURPOSE, NOTIFY LAB WITHIN 5  DAYS.  LOWEST DETECTABLE LIMITS FOR URINE DRUG SCREEN Drug Class                     Cutoff (ng/mL) Amphetamine and metabolites    1000 Barbiturate and metabolites    200 Benzodiazepine                 200 Tricyclics and metabolites     300 Opiates and metabolites        300 Cocaine and metabolites        300 THC                            50 Performed at [facility], [address] [place], [place] [address]   CBC with Diff     Status: Abnormal   Collection Time: 04/27/21 11:30 PM  Result Value Ref Range   WBC 5.2 4.0 - 10.5 K/uL   RBC 4.19 (L) 4.22 - 5.81 MIL/uL   Hemoglobin 14.4 13.0 - 17.0 g/dL   HCT 05.6 97.9 - 48.0 %   MCV 99.3 80.0 - 100.0 fL   MCH 34.4 (H) 26.0 - 34.0 pg   MCHC 34.6 30.0 - 36.0 g/dL   RDW 16.5 53.7 - 48.2 %   Platelets 124 (L) 150 - 400 K/uL   nRBC 0.0 0.0 - 0.2 %   Neutrophils Relative % 65 %   Neutro Abs 3.4 1.7 - 7.7 K/uL   Lymphocytes Relative 22 %   Lymphs Abs 1.2 0.7 - 4.0 K/uL   Monocytes Relative 10 %   Monocytes Absolute 0.5 0.1 - 1.0 K/uL   Eosinophils Relative 1 %   Eosinophils Absolute 0.0 0.0 - 0.5 K/uL   Basophils Relative 2 %   Basophils Absolute 0.1 0.0 - 0.1 K/uL   Immature Granulocytes 0 %   Abs Immature Granulocytes 0.01 0.00 - 0.07 K/uL    Comment: Performed at [facility], [address], [place], [place] [address]  Acetaminophen level     Status: Abnormal   Collection Time: 04/27/21 11:30 PM  Result Value Ref Range   Acetaminophen (Tylenol), Serum <10 (L) 10 - 30 ug/mL    Comment: (NOTE) Therapeutic concentrations vary significantly. A range of 10-30 ug/mL  may be an effective concentration for many patients. However, some  are best treated at concentrations outside of this range. Acetaminophen concentrations >150 ug/mL at 4 hours after ingestion  and >50 ug/mL at 12 hours after ingestion are often associated with  toxic reactions.  Performed at [facility],  [address] [place], [place] [address]   Salicylate level     Status: Abnormal   Collection Time: 04/27/21 11:30 PM  Result Value Ref Range   Salicylate Lvl <7.0 (L) 7.0 - 30.0 mg/dL    Comment: Performed at [facility], [address] [place], [place] [address]  CBG monitoring, ED     Status: None   Collection Time: 04/28/21 12:53 PM  Result Value Ref Range   Glucose-Capillary 85 70 - 99 mg/dL    Comment: Glucose reference range applies only to samples taken after fasting for at least 8 hours.    Blood Alcohol level:  Lab Results  Component Value Date   ETH 291 (H) 04/27/2021   ETH 241 (H) 03/13/2021    Metabolic Disorder Labs: Lab Results  Component Value Date  HGBA1C 5.9 (H) 02/24/2021   MPG 122.63 02/24/2021   No results found for: PROLACTIN Lab Results  Component Value Date   CHOL 238 (H) 02/24/2021   TRIG 122 02/24/2021   HDL 92 02/24/2021   CHOLHDL 2.6 02/24/2021   VLDL 24 02/24/2021   LDLCALC 122 (H) 02/24/2021    Physical Findings: AIMS: Facial and Oral Movements Muscles of Facial Expression: None, normal Lips and Perioral Area: None, normal Jaw: None, normal Tongue: None, normal,Extremity Movements Upper (arms, wrists, hands, fingers): Severe (Pt unable to hold cup-had to be fed fluids by Clinical research associate at [employer] off [address]) Lower (legs, knees, ankles, toes): Severe, Trunk Movements Neck, shoulders, hips: None, normal, Overall Severity Severity of abnormal movements (highest score from questions above): Severe (Pt unable to hold cup-had to be fed fluids by Clinical research associate at [employer] off [address]) Incapacitation due to abnormal movements: Moderate Patient's awareness of abnormal movements (rate only patient's report): Aware, moderate distress, Dental Status Current problems with teeth and/or dentures?: No Does patient usually wear dentures?: No  CIWA:  CIWA-Ar Total: 11 COWS:     Musculoskeletal: Strength & Muscle Tone:  within normal limits Gait & Station: normal Patient leans: N/A  Psychiatric Specialty Exam:  Presentation  General Appearance: Appropriate for Environment; Disheveled  Eye Contact:Fair  Speech:Clear and Coherent  Speech Volume:Normal  Handedness:Right   Mood and Affect  Mood:Depressed  Affect:Tearful; Congruent   Thought Process  Thought Processes:Coherent; Linear  Descriptions of Associations:Intact  Orientation:Full (Time, Place and Person)  Thought Content:Logical  History of Schizophrenia/Schizoaffective disorder:No  Duration of Psychotic Symptoms:No data recorded Hallucinations:Hallucinations: None  Ideas of Reference:None  Suicidal Thoughts:Suicidal Thoughts: Yes, Passive SI Passive Intent and/or Plan: Without Plan; Without Means to Carry Out  Homicidal Thoughts:Homicidal Thoughts: No   Sensorium  Memory:Immediate Good  Judgment:Poor  Insight:Poor   Executive Functions  Concentration:Fair  Attention Span:Fair  Recall:Fair  Fund of Knowledge:Fair  Language:Fair   Psychomotor Activity  Psychomotor Activity:Psychomotor Activity: Normal   Assets  Assets:Desire for Improvement; Physical Health; Communication Skills   Sleep  Sleep:Sleep: Fair    Physical Exam: Physical Exam Vitals and nursing note reviewed.  HENT:     Head: Normocephalic and atraumatic.  Pulmonary:     Effort: Pulmonary effort is normal.  Neurological:     General: No focal deficit present.     Mental Status: He is alert and oriented to person, place, and time.   Review of Systems  All other systems reviewed and are negative. Blood pressure (!) 124/98, pulse (!) 102, temperature 98.3 F (36.8 C), temperature source Oral, resp. rate 20, height 5\' 9"  (1.753 m), weight 71.2 kg, SpO2 98 %. Body mass index is 23.18 kg/m.   Treatment Plan Summary: Daily contact with patient to assess and evaluate symptoms and progress in treatment, Medication management, and  Plan patient is seen and examined.  Patient is a 45 year old male with the above-stated past psychiatric history who is seen in follow-up.  Diagnosis: 1.  Alcohol withdrawal, uncomplicated 2.  Alcohol dependence 3.  Substance-induced mood disorder 4.  Reported history of posttraumatic stress disorder 5.  Homelessness 6.  Alcohol induced gastritis 7.  Alcohol induced transaminitis 8.  Hypertension  Pertinent findings on examination today: 1.  Essentially no change in presentation from admission on 04/28/2021.  Plan: 1.  Continue amlodipine 10 mg p.o. daily for hypertension. 2.  Continue folic acid 1 mg p.o. daily for nutritional supplementation. 3.  Continue gabapentin 300 mg p.o. 3 times daily  for alcohol dependence. 4.  Continue Keppra 500 mg p.o. twice daily for empiric treatment for alcohol withdrawal related seizure prevention. 5.  Decrease lorazepam back to 1 mg p.o. every 6 hours as needed ACIWA greater than 10. 6.  Continue mirtazapine 15 mg p.o. nightly for mood, sleep and appetite stimulation. 7.  Continue Zofran 4 mg p.o. every 8 hours as needed nausea and vomiting. 8.  Continue Protonix 40 mg p.o. daily for gastric protection. 9.  Continue thiamine 100 mg p.o. daily for nutritional supplementation. 10.  Disposition planning high-in progress.  Antonieta PertGreg Lawson Tiffay Pinette, MD 04/29/2021, 11:15 AM

## 2021-04-29 NOTE — Progress Notes (Signed)
[facility] Group Notes:  (Nursing/MHT/Case Management/Adjunct)  Date:  04/29/2021  Time:  2015  Type of Therapy:   wrap up group  Participation Level:  Active  Participation Quality:  Appropriate, Attentive, Sharing, and Supportive  Affect:  Flat  Cognitive:  Alert  Insight:  Improving  Engagement in Group:  Engaged  Modes of Intervention:  Clarification, Education, and Support  Summary of Progress/Problems: Positive thinking and positive change were discussed.  Francisco Houston 04/29/2021, 8:49 PM

## 2021-04-30 ENCOUNTER — Inpatient Hospital Stay (HOSPITAL_COMMUNITY): Payer: Federal, State, Local not specified - Other

## 2021-04-30 DIAGNOSIS — F1024 Alcohol dependence with alcohol-induced mood disorder: Principal | ICD-10-CM

## 2021-04-30 DIAGNOSIS — F10229 Alcohol dependence with intoxication, unspecified: Secondary | ICD-10-CM

## 2021-04-30 NOTE — Progress Notes (Signed)
   04/30/21 2152  Psych Admission Type (Psych Patients Only)  Admission Status Voluntary  Psychosocial Assessment  Patient Complaints Anxiety;Depression  Eye Contact Fair  Facial Expression Anxious;Sullen;Sad;Worried  Affect Anxious;Appropriate to circumstance  Speech Logical/coherent  Interaction Assertive  Motor Activity Fidgety  Appearance/Hygiene Improved  Behavior Characteristics Cooperative;Appropriate to situation  Mood Anxious;Depressed  Thought Process  Coherency WDL  Content WDL  Delusions None reported or observed  Perception WDL  Hallucination None reported or observed  Judgment WDL  Confusion None  Danger to Self  Current suicidal ideation? Denies  Self-Injurious Behavior No self-injurious ideation or behavior indicators observed or expressed   Agreement Not to Harm Self Yes  Description of Agreement Verbal Contract  Danger to Others  Danger to Others None reported or observed

## 2021-04-30 NOTE — [facility] Group Notes (Signed)
[facility] Group Notes: (Clinical Social Work)   04/30/2021      Type of Therapy:  Group Therapy   Participation Level:  Did Not Attend - was invited both individually by MHT and by overhead announcement, chose not to attend.  He did arrive in group at 10:45, but was almost immediately called out to see a provider.   Ambrose Mantle, LCSW 04/30/2021, 1:45 PM

## 2021-04-30 NOTE — Progress Notes (Addendum)
[facility] MD Progress Note  04/30/2021 2:38 PM Francisco Houston  MRN:  [id number]  Subjective: Francisco Houston reports, "I feel shitty, extremely tired & I'm still dreaming about being in the woods. My left elbow is swollen & painful. I feel few days ago & hit it on some wood. I feel very depressed".  Reason for admission: Patient is a 45 year old male with a past psychiatric history significant for substance-induced mood disorder, alcohol dependence who presented on 04/27/2021 to the [facility] emergency department with suicidal ideation and request for detoxification. Daily notes: Francisco Houston is seen, chart reviewed. The chart findings discussed with the treatment team. He presents alert, oriented & aware of situation. He is visible on the unit, attending group sessions. He says he came to the hospital because he was feeling like he did not want to be alive any more. He says this was triggered by the fact he has lost everything that mattered to him. He says he wife left him, his other family moved to [place] & finally, he lost all his belongings. Then, he got very depressed, started drinking a lot of alcohol, watching a lot of TV & did not have it in him to get up & go to work. He says he lost his job as result. He reports today that he is still feeling depressed, having withdrawal symptoms (dizziness, shaky & anxious). He says he tossed & turned last night. Complained of left elbow pain/swellings. Says he fell few days ago & hit his left elbow on a log of weed. An Xray of the left elbow has been ordered, awaiting result. [name] currently denies any SIHI, AVH, delusional thoughts or paranoia. He does not appear to be responding to any internal stimuli.  Per previous notes: Objective: Patient is seen and examined.  Patient is a 45 year old male with the above-stated past psychiatric history who is seen in follow-up.  He is essentially unchanged from yesterday.  He stated that after his discharge from the  hospital on his last hospitalization he had gone to stay with a friend.  The plan was to go to an [facility].  His friend decided to go to rehab, and when his friend went to rehab he dropped him off at the back of a [employer].  He stated he has been staying behind that [employer] since that date.  When asked about why he did not present to one of the homeless shelters or go to the [facility] he was unable to explain that.  We discussed his alcohol intake, and I asked whether or not he had been taking his medications and he denied that.  He stated he did not take any of his medications after discharge.  I asked him if he had enough money to pay for alcohol why he did not use that for medications, and again he was unable to answer that question.  He remains homeless.  Yesterday he had a significant degree of tremulousness, and he was placed on Keppra 500 mg p.o. twice daily empirically for alcohol withdrawal seizure prevention.  Additionally his Ativan dosage was increased to 2 mg p.o. every 6 hours as needed a CIWA greater than 10.  He does look comfortable this morning.  He is mildly tachycardic with a rate of 105.  Blood pressure stable.  Pulse oximetry on room air is 98%.  His most recent CIWA was 11.  Unfortunately sleep was not recorded.  No new laboratories.  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 No pertinent surgical  history. Family History: History reviewed. No pertinent family history. Family Psychiatric  History: See admission H&P Social History:  Social History   Substance and Sexual Activity  Alcohol Use Yes   Comment: heavily     Social History   Substance and Sexual Activity  Drug Use Yes   Types: Cocaine    Social History   Socioeconomic History   Marital status: Divorced    Spouse name: Not on file   Number of children: Not on file   Years of education: Not on file   Highest education level: Not on file  Occupational History   Not on file  Tobacco Use   Smoking status: Some Days    Pack years: 0.00   Smokeless tobacco: Never  Substance and Sexual Activity   Alcohol use: Yes    Comment: heavily   Drug use: Yes    Types: Cocaine   Sexual activity: Not on file  Other Topics Concern   Not on file  Social History Narrative   ** Merged History Encounter **       Social Determinants of Health   Financial Resource Strain: Not on file  Food Insecurity: Not on file  Transportation Needs: Not on file  Physical Activity: Not on file  Stress: Not on file  Social Connections: Not on file   Additional Social History:  Sleep: Fair  Appetite:  Fair  Current Medications: Current Facility-Administered Medications  Medication Dose Route Frequency Provider Last Rate Last Admin   acetaminophen (TYLENOL) tablet 650 mg  650 mg Oral Q6H PRN Bobbitt, Shalon E, NP   650 mg at 04/28/21 2122   amLODipine (NORVASC) tablet 10 mg  10 mg Oral Daily Antonieta Pert, MD   10 mg at 04/30/21 5329   folic acid (FOLVITE) tablet 1 mg  1 mg Oral Daily Antonieta Pert, MD   1 mg at 04/30/21 9242   gabapentin (NEURONTIN) capsule 300 mg  300 mg Oral TID Antonieta Pert, MD   300 mg at 04/30/21 1135   levETIRAcetam (KEPPRA) tablet 500 mg  500 mg Oral BID Antonieta Pert, MD   500 mg at 04/30/21 6834   LORazepam (ATIVAN) tablet 1 mg  1 mg Oral Q6H PRN Antonieta Pert, MD   1 mg at  04/29/21 2117   magnesium hydroxide (MILK OF MAGNESIA) suspension 30 mL  30 mL Oral Daily PRN Antonieta Pert, MD       mirtazapine (REMERON) tablet 15 mg  15 mg Oral QHS Antonieta Pert, MD   15 mg at 04/29/21 2117   nicotine polacrilex (NICORETTE) gum 2 mg  2 mg Oral PRN Bobbitt, Shalon E, NP   2 mg at 04/30/21 1336   ondansetron (ZOFRAN) tablet 4 mg  4 mg Oral Q8H PRN Antonieta Pert, MD   4 mg at 04/29/21 2117   pantoprazole (PROTONIX) EC tablet 40 mg  40 mg Oral Daily  Antonieta Pertlary, Greg Lawson, MD   40 mg at 04/30/21 [id number]   thiamine tablet 100 mg  100 mg Oral Daily Antonieta Pertlary, Greg Lawson, MD   100 mg at 04/30/21 [id number]   Or   thiamine (B-1) injection 100 mg  100 mg Intravenous Daily Antonieta Pertlary, Greg Lawson, MD       Lab Results:  No results found for this or any previous visit (from the past 48 hour(s)).  Blood Alcohol level:  Lab Results  Component Value Date   ETH 291 (H) 04/27/2021   ETH 241 (H) 03/13/2021   Metabolic Disorder Labs: Lab Results  Component Value Date   HGBA1C 5.9 (H) 02/24/2021   MPG 122.63 02/24/2021   No results found for: PROLACTIN Lab Results  Component Value Date   CHOL 238 (H) 02/24/2021   TRIG 122 02/24/2021   HDL 92 02/24/2021   CHOLHDL 2.6 02/24/2021   VLDL 24 02/24/2021   LDLCALC 122 (H) 02/24/2021   Physical Findings: AIMS: Facial and Oral Movements Muscles of Facial Expression: None, normal Lips and Perioral Area: None, normal Jaw: None, normal Tongue: None, normal,Extremity Movements Upper (arms, wrists, hands, fingers): Minimal Lower (legs, knees, ankles, toes): None, normal, Trunk Movements Neck, shoulders, hips: None, normal, Overall Severity Severity of abnormal movements (highest score from questions above): Minimal Incapacitation due to abnormal movements: None, normal Patient's awareness of abnormal movements (rate only patient's report): No Awareness, Dental Status Current problems with teeth and/or dentures?: No Does patient usually  wear dentures?: No  CIWA:  CIWA-Ar Total: 4 COWS:     Musculoskeletal: Strength & Muscle Tone: within normal limits Gait & Station: normal Patient leans: N/A  Psychiatric Specialty Exam:  Presentation  General Appearance: Appropriate for Environment; Casual  Eye Contact:Fair  Speech:Clear and Coherent; Normal Rate  Speech Volume:Normal  Handedness:Right  Mood and Affect  Mood:Anxious; Depressed; Hopeless  Affect:Congruent  Thought Process  Thought Processes:Coherent  Descriptions of Associations:Intact  Orientation:Full (Time, Place and Person)  Thought Content:Logical  History of Schizophrenia/Schizoaffective disorder:No  Duration of Psychotic Symptoms:No data recorded Hallucinations:Hallucinations: None  Ideas of Reference:None  Suicidal Thoughts:Suicidal Thoughts: No SI Passive Intent and/or Plan: Without Intent; Without Plan; Without Means to Carry Out; Without Access to Means  Homicidal Thoughts:Homicidal Thoughts: No   Sensorium  Memory:Recent Good; Immediate Good; Remote Good  Judgment:Fair  Insight:Poor   Executive Functions  Concentration:Fair  Attention Span:Fair  Recall:Fair  Fund of Knowledge:Fair  Language:Fair  Psychomotor Activity  Psychomotor Activity:Psychomotor Activity: Normal  Assets  Assets:Communication Skills; Resilience  Sleep  Sleep:Sleep: Fair Number of Hours of Sleep: 5.5  Physical Exam: Physical Exam Vitals and nursing note reviewed.  HENT:     Head: Normocephalic and atraumatic.  Pulmonary:     Effort: Pulmonary effort is normal.  Neurological:     General: No focal deficit present.     Mental Status: He is alert and oriented to person, place, and time.   Review of Systems  All other systems reviewed and are negative. Blood pressure 109/85, pulse (!) 111, temperature 98.7 F (37.1 C), temperature source Oral, resp. rate 18, height 5\' 9"  (1.753 m), weight 71.2 kg, SpO2 98 %. Body mass index is  23.18 kg/m.  Treatment Plan Summary: Daily contact with patient to assess and evaluate symptoms and progress in treatment, Medication management, and Plan patient is seen and examined.  Patient is a 45 year old male with the above-stated past psychiatric history who is seen in follow-up.  Diagnosis: 1.  Alcohol withdrawal, uncomplicated 2.  Alcohol dependence 3.  Substance-induced mood disorder 4.  Reported history of posttraumatic stress disorder 5.  Homelessness 6.  Alcohol induced gastritis 7.  Alcohol induced transaminitis 8.  Hypertension  Pertinent findings on examination today: 1.  Essentially no change in presentation from admission on 04/28/2021.  Plan: 1.  Continue amlodipine 10 mg p.o. daily for hypertension. 2.  Continue folic acid 1 mg p.o. daily for nutritional supplementation. 3.  Continue gabapentin 300 mg p.o. 3 times daily for alcohol dependence. 4.  Continue Keppra 500 mg p.o. twice daily for empiric treatment for alcohol withdrawal related seizure prevention. 5.  Continue lorazepam 1 mg p.o. every 6 hours as needed for CIWA greater than 10. 6.  Continue mirtazapine 15 mg p.o. nightly for mood, sleep and appetite stimulation. 7.  Continue Zofran 4 mg p.o. every 8 hours as needed nausea and vomiting. 8.  Continue Protonix 40 mg p.o. daily for gastric protection. 9.  Continue thiamine 100 mg p.o. daily for nutritional supplementation. 10. Disposition planning high-in progress.  Armandina Stammer, NP, pmhnp, fnp-bc 04/30/2021, 2:38 PM

## 2021-04-30 NOTE — Progress Notes (Signed)
Adult Psychoeducational Group Note  Date:  04/30/2021 Time:  9:22 PM  Group Topic/Focus:  Wrap-Up Group:   The focus of this group is to help patients review their daily goal of treatment and discuss progress on daily workbooks.  Participation Level:  Minimal  Participation Quality:  Appropriate  Affect:  Appropriate  Cognitive:  Oriented  Insight: Appropriate  Engagement in Group:  Engaged  Modes of Intervention:  Education  Additional Comments:  Patient attended and participated in group tonight He reports that the most significant thing that happen today was he is getting over his withdrawals  Scot Dock 04/30/2021, 9:22 PM

## 2021-04-30 NOTE — Progress Notes (Signed)
Pt c/o of pain in left elbow.  Pt is scheduled to get xray this afternoon.  Pt is doing well and CIWA score is 4. Pt denies SI/HI/AVH.    04/30/21 0900  Psych Admission Type (Psych Patients Only)  Admission Status Voluntary  Psychosocial Assessment  Patient Complaints Anxiety;Depression  Eye Contact Fair  Facial Expression Anxious;Sullen;Sad;Worried  Affect Anxious;Appropriate to circumstance;Depressed;Sad;Sullen  Speech Logical/coherent  Interaction Assertive  Motor Activity Fidgety;Tremors  Appearance/Hygiene Improved  Behavior Characteristics Cooperative;Appropriate to situation;Anxious  Mood Depressed;Anxious  Thought Process  Coherency WDL  Content WDL  Delusions None reported or observed  Perception WDL  Hallucination None reported or observed  Judgment WDL  Confusion None  Danger to Self  Current suicidal ideation? Denies  Self-Injurious Behavior No self-injurious ideation or behavior indicators observed or expressed   Agreement Not to Harm Self Yes  Description of Agreement Pt verbally contracts for safety  Danger to Others  Danger to Others None reported or observed

## 2021-05-01 MED ORDER — LEVETIRACETAM 250 MG PO TABS
250.0000 mg | ORAL_TABLET | Freq: Two times a day (BID) | ORAL | Status: DC
  Administered 2021-05-01 – 2021-05-02 (×2): 250 mg via ORAL
  Filled 2021-05-01 (×5): qty 1

## 2021-05-01 MED ORDER — TRAZODONE HCL 50 MG PO TABS
50.0000 mg | ORAL_TABLET | Freq: Every evening | ORAL | Status: DC | PRN
  Administered 2021-05-01: 50 mg via ORAL
  Filled 2021-05-01: qty 1

## 2021-05-01 NOTE — Progress Notes (Addendum)
[facility] MD Progress Note  05/01/2021 12:13 PM Francisco Houston  MRN:  [id number] Subjective: Francisco Houston is a 48 YOM with a PPHx of PTSD, alcohol use disorder, and homelessness, presenting with depressed mood and suicidal ideation. On interview today, patient is irritable and complains about staff being disrespectful to him, not being able to sleep, and not having enough nicotine gum. With active listening patient calmed down and engaged appropriately. Pt informed of XR results. He reports that his arm feels improved compared to admission. With regards to disposition, patient reports not wanting to go to rehab, but instead wants to go to [facility] or [facility] in [place]. Denies SI/AVH at this time. Denies medication side effects.  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 No pertinent surgical history. Family History: History reviewed. No pertinent family history.  Social History:  Social History   Substance and Sexual Activity  Alcohol Use Yes   Comment: heavily     Social History   Substance and Sexual Activity  Drug Use Yes   Types: Cocaine    Social History   Socioeconomic History   Marital status: Divorced    Spouse name: Not on file   Number of children: Not on file   Years of education: Not on file   Highest education level: Not on file  Occupational History   Not on file  Tobacco Use   Smoking  status: Some Days    Pack years: 0.00   Smokeless tobacco: Never  Substance and Sexual Activity   Alcohol use: Yes    Comment: heavily   Drug use: Yes    Types: Cocaine   Sexual activity: Not on file  Other Topics Concern   Not on file  Social History Narrative   ** Merged History Encounter **       Social Determinants of Health   Financial Resource Strain: Not on file  Food Insecurity: Not on file  Transportation Needs: Not on file  Physical Activity: Not on file  Stress: Not on file  Social Connections: Not on file      Sleep: Poor  Appetite:  Good  Current Medications: Current Facility-Administered Medications  Medication Dose Route Frequency Provider Last Rate Last Admin   acetaminophen (TYLENOL) tablet 650 mg  650 mg Oral Q6H PRN Bobbitt, Shalon E, NP   650 mg at 04/28/21 2122   amLODipine (NORVASC) tablet 10 mg  10 mg Oral Daily Antonieta Pert, MD   10 mg at 05/01/21 3335   folic acid (FOLVITE) tablet 1 mg  1 mg Oral Daily Antonieta Pert, MD   1 mg at 05/01/21 0743   gabapentin (NEURONTIN) capsule 300 mg  300 mg Oral TID Antonieta Pert, MD   300 mg at 05/01/21 0743   levETIRAcetam (KEPPRA) tablet 250 mg  250 mg Oral BID Antonieta Pert, MD       LORazepam (ATIVAN) tablet 1 mg  1 mg Oral Q6H PRN Jola Babinski,  Marlane Mingle, MD   1 mg at 04/29/21 2117   magnesium hydroxide (MILK OF MAGNESIA) suspension 30 mL  30 mL Oral Daily PRN Antonieta Pert, MD       mirtazapine (REMERON) tablet 15 mg  15 mg Oral QHS Antonieta Pert, MD   15 mg at 04/30/21 2152   nicotine polacrilex (NICORETTE) gum 2 mg  2 mg Oral PRN Bobbitt, Shalon E, NP   2 mg at 05/01/21 0940   ondansetron (ZOFRAN) tablet 4 mg  4 mg Oral Q8H PRN Antonieta Pert, MD   4 mg at 04/29/21 2117   pantoprazole (PROTONIX) EC tablet 40 mg  40 mg Oral Daily Antonieta Pert, MD   40 mg at 05/01/21 1660   thiamine tablet 100 mg  100 mg Oral Daily Antonieta Pert, MD   100 mg at 05/01/21 6301   Or    thiamine (B-1) injection 100 mg  100 mg Intravenous Daily Antonieta Pert, MD        Lab Results: No results found for this or any previous visit (from the past 48 hour(s)).  Blood Alcohol level:  Lab Results  Component Value Date   ETH 291 (H) 04/27/2021   ETH 241 (H) 03/13/2021    Metabolic Disorder Labs: Lab Results  Component Value Date   HGBA1C 5.9 (H) 02/24/2021   MPG 122.63 02/24/2021   No results found for: PROLACTIN Lab Results  Component Value Date   CHOL 238 (H) 02/24/2021   TRIG 122 02/24/2021   HDL 92 02/24/2021   CHOLHDL 2.6 02/24/2021   VLDL 24 02/24/2021   LDLCALC 122 (H) 02/24/2021    Physical Findings: MSK exam: elbow swelling and ROM improved compared to previous days CIWA:  CIWA-Ar Total: 4 COWS:     Musculoskeletal: Strength & Muscle Tone: within normal limits Gait & Station: normal Patient leans: N/A  Psychiatric Specialty Exam:  Presentation  General Appearance: Appropriate for Environment  Eye Contact:Good  Speech:Clear and Coherent  Speech Volume:Normal  Handedness:Right   Mood and Affect  Mood:Irritable  Affect:Congruent   Thought Process  Thought Processes:Coherent  Descriptions of Associations:Intact  Orientation:Full (Time, Place and Person)  Thought Content:Logical  History of Schizophrenia/Schizoaffective disorder:No  Duration of Psychotic Symptoms:No data recorded Hallucinations:Hallucinations: None  Ideas of Reference:None  Suicidal Thoughts:Suicidal Thoughts: No SI Passive Intent and/or Plan: Without Intent  Homicidal Thoughts:Homicidal Thoughts: No   Sensorium  Memory:Immediate Good; Recent Good; Remote Good  Judgment:Poor  Insight:Fair   Executive Functions  Concentration:Fair  Attention Span:Fair  Recall:Fair  Fund of Knowledge:Fair  Language:Fair   Psychomotor Activity  Psychomotor Activity:Psychomotor Activity: Normal   Assets  Assets:Communication Skills; Desire for  Improvement; Physical Health   Sleep  Sleep:Sleep: Poor Number of Hours of Sleep: 5.5    Physical Exam: Refer to H&P ROS negative for CP, SOB, HA, N/V/GI upset Blood pressure (!) 124/95, pulse 72, temperature 97.9 F (36.6 C), temperature source Oral, resp. rate 18, height 5\' 9"  (1.753 m), weight 71.2 kg, SpO2 100 %. Body mass index is 23.18 kg/m.   Assessment: Francisco Houston is a 27 YOM with a PPHx of PTSD, alcohol use disorder, and homelessness who presents with depressed mood and passive SI. Patient has significant risk factors for completed suicide, including male gender, middle age, alcohol abuse, and divorced status, requiring inpatient admission.   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

## 2021-05-01 NOTE — Tx Team (Signed)
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) Active Problems:   Substance induced mood disorder (HCC)   Current Medications:  Current Facility-Administered Medications  Medication Dose Route Frequency Provider Last Rate Last Admin   acetaminophen (TYLENOL) tablet 650 mg  650 mg Oral Q6H PRN Bobbitt, Shalon E, NP   650 mg at 04/28/21 2122   amLODipine (NORVASC) tablet 10 mg  10 mg Oral Daily Antonieta Pert, MD   10 mg at 05/01/21 1025   folic acid (FOLVITE) tablet 1 mg  1 mg Oral Daily Antonieta Pert, MD   1 mg at 05/01/21 0743   gabapentin (NEURONTIN) capsule 300 mg  300 mg Oral TID Antonieta Pert, MD   300 mg at 05/01/21 0743   levETIRAcetam (KEPPRA) tablet 250 mg  250 mg Oral BID Antonieta Pert, MD       LORazepam (ATIVAN) tablet 1 mg  1 mg Oral Q6H PRN Antonieta Pert, MD   1 mg at 04/29/21 2117   magnesium hydroxide (MILK OF MAGNESIA) suspension 30 mL  30 mL Oral Daily PRN Antonieta Pert, MD       mirtazapine (REMERON) tablet 15 mg  15 mg Oral QHS Antonieta Pert, MD   15 mg at 04/30/21 2152   nicotine polacrilex (NICORETTE) gum 2 mg  2 mg Oral PRN Bobbitt, Shalon E, NP   2 mg at 05/01/21 0940   ondansetron (ZOFRAN) tablet 4 mg  4 mg Oral Q8H PRN Antonieta Pert, MD   4 mg at 04/29/21 2117   pantoprazole (PROTONIX) EC tablet 40 mg  40 mg Oral Daily Antonieta Pert, MD   40 mg at 05/01/21 8527   thiamine tablet 100 mg  100 mg Oral Daily Antonieta Pert, MD   100 mg at 05/01/21 7824   Or   thiamine (B-1) injection 100 mg  100 mg Intravenous Daily Antonieta Pert, MD       PTA  Medications: Medications Prior to Admission  Medication Sig Dispense Refill Last Dose   amLODipine (NORVASC) 10 MG tablet Take 1 tablet (10 mg total) by mouth daily. 30 tablet 0    gabapentin (NEURONTIN) 100 MG capsule Take 2 capsules (200 mg total) by mouth 3 (three) times daily. 180 capsule 0    mirtazapine (REMERON) 15 MG tablet Take 1 tablet (15 mg total) by mouth at bedtime. 30 tablet 0    pantoprazole (PROTONIX) 40 MG tablet Take 1 tablet (40 mg total) by mouth daily. 30 tablet 0     Patient Stressors: Financial difficulties Marital or family conflict Medication change or noncompliance Occupational concerns Substance abuse  Patient Strengths: Average or above average intelligence Capable of independent living Manufacturing systems engineer Motivation for treatment/growth  Treatment Modalities: Medication Management, Group therapy, Case management,  1 to 1 session with clinician, Psychoeducation, Recreational therapy.   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  Therapeutic Interventions: 1 to 1 sessions, Unit Group sessions and Medication administration.  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  Therapeutic Interventions: 1 to 1 sessions, Unit Group sessions and Medication administration.  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 RN will report any adverse and/or side effects to prescribing provider.  Therapeutic Interventions: 1 on 1 counseling sessions, Psychoeducation, Medication administration, Evaluate responses to treatment, Monitor vital signs and CBGs as ordered, Perform/monitor CIWA, COWS, AIMS and Fall Risk screenings as ordered, Perform wound care treatments as ordered.  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  Short Term Goals: Engage patient in aftercare planning with referrals and resources, Increase social support, Increase ability to appropriately verbalize feelings, Increase emotional regulation, Facilitate acceptance of mental health diagnosis and concerns, Facilitate patient progression through stages of change regarding substance use diagnoses and concerns, Identify triggers associated with mental health/substance abuse issues, and Increase skills for wellness and recovery  Therapeutic Interventions: Assess for all discharge needs, 1 to 1 time with Social worker, Explore available resources and support systems, Assess for adequacy in community support network, Educate family and significant other(s) on suicide prevention, Complete Psychosocial Assessment, Interpersonal group therapy.  Evaluation of Outcomes: Progressing   Progress in Treatment: Attending groups: No. Participating in groups: No. Taking medication as prescribed: Yes. Toleration medication: Yes. Family/Significant other contact made: No, will contact:  patient has declined for contact Patient understands diagnosis: Yes. Discussing patient identified problems/goals with staff: Yes. Medical problems stabilized or resolved: Yes. Denies suicidal/homicidal ideation: Yes. Issues/concerns per patient self-inventory: No. Other: n/a  New problem(s) identified: No, Describe:  n/a  New Short Term/Long Term Goal(s):detox, medication management for mood stabilization; elimination of SI thoughts; development of comprehensive mental wellness/sobriety plan    Patient Goals:  Patient states that he would like to get into stable sober living housing so that he can stabilize mental health  Discharge Plan or Barriers: Patient recently admitted. CSW will continue to follow and assess for appropriate referrals and possible discharge planning.    Reason  for Continuation of Hospitalization:  Anxiety Depression Suicidal ideation Withdrawal symptoms  Estimated Length of Stay: 3-5 days  Attendees: Patient: Francisco Houston 05/01/2021, 9:10am  Physician: Landry Mellow, MD 05/01/2021, 9:10am  Nursing:    RN Care Manager:   Social Worker: Anson [place], LCSW 05/01/2021, 9:10am  Recreational Therapist:    Other:    Other:    Other:     Scribe for Treatment Team: Beatris Si, LCSW 05/01/2021 11:59 AM

## 2021-05-01 NOTE — Progress Notes (Signed)
Patient rated his day as a 5 out of 10 since he had a bad start to his day. His goal for tomorrow is to get discharged.

## 2021-05-01 NOTE — Progress Notes (Signed)
Adult Psychoeducational Group Note  Date:  05/01/2021 Time:  1:31 PM  Group Topic/Focus:  Orientation:   The focus of this group is to educate the patient on the purpose and policies of crisis stabilization and provide a format to answer questions about their admission.  The group details unit policies and expectations of patients while admitted.  Participation Level:  Active  Participation Quality:  Appropriate  Affect:  Appropriate  Cognitive:  Appropriate  Insight: Appropriate  Engagement in Group:  Engaged  Modes of Intervention:  Discussion  Additional Comments:  Patient attended morning orientation and goals group and said his goal for today is to discuss his discharged plan and get his sleep medications adjusted.    Timithy Arons W Braileigh Landenberger 05/01/2021, 1:31 PM

## 2021-05-01 NOTE — Progress Notes (Signed)
Recreation Therapy Notes  Date: 05/01/2021 Time: 930a  Topic: Leisure Participation   Goal Area(s) Addresses:  Patient will complete packet of holiday-theme worksheets, as a healthy free-time activity on unit.  Intervention: Independent Leisure   Education: Leisure Exposure- Word puzzles and [employer]   Comments: LRT provided pt a leisure packet in celebration of Independence Day. Pt given the option to complete the packet in the dayroom with [facility] staff and peers or at their own pace in their room.   Nicholos Johns Lovelyn Sheeran, LRT/CTRS Benito Mccreedy Sakia Schrimpf 05/01/2021, 12:15 PM

## 2021-05-01 NOTE — Progress Notes (Signed)
   05/01/21 1100  Psych Admission Type (Psych Patients Only)  Admission Status Voluntary  Psychosocial Assessment  Patient Complaints Anxiety;Sleep disturbance  Eye Contact Fair  Facial Expression Sad;Worried  Affect Anxious;Appropriate to circumstance  Speech Logical/coherent  Interaction Assertive  Motor Activity Fidgety  Appearance/Hygiene Improved  Behavior Characteristics Cooperative;Appropriate to situation  Mood Depressed;Anxious  Aggressive Behavior  Targets Self  Type of Behavior Verbal  Effect No apparent injury  Thought Process  Coherency WDL  Content WDL  Delusions None reported or observed  Perception WDL  Hallucination None reported or observed  Judgment WDL  Confusion None  Danger to Self  Current suicidal ideation? Denies  Self-Injurious Behavior No self-injurious ideation or behavior indicators observed or expressed   Agreement Not to Harm Self Yes  Description of Agreement Verbal Contract  Danger to Others  Danger to Others None reported or observed

## 2021-05-02 LAB — CBC WITH DIFFERENTIAL/PLATELET
Abs Immature Granulocytes: 0.03 10*3/uL (ref 0.00–0.07)
Basophils Absolute: 0.1 10*3/uL (ref 0.0–0.1)
Basophils Relative: 2 %
Eosinophils Absolute: 0.1 10*3/uL (ref 0.0–0.5)
Eosinophils Relative: 3 %
HCT: 43.1 % (ref 39.0–52.0)
Hemoglobin: 14.2 g/dL (ref 13.0–17.0)
Immature Granulocytes: 1 %
Lymphocytes Relative: 30 %
Lymphs Abs: 1.6 10*3/uL (ref 0.7–4.0)
MCH: 34.1 pg — ABNORMAL HIGH (ref 26.0–34.0)
MCHC: 32.9 g/dL (ref 30.0–36.0)
MCV: 103.4 fL — ABNORMAL HIGH (ref 80.0–100.0)
Monocytes Absolute: 0.6 10*3/uL (ref 0.1–1.0)
Monocytes Relative: 11 %
Neutro Abs: 3 10*3/uL (ref 1.7–7.7)
Neutrophils Relative %: 53 %
Platelets: 170 10*3/uL (ref 150–400)
RBC: 4.17 MIL/uL — ABNORMAL LOW (ref 4.22–5.81)
RDW: 13.5 % (ref 11.5–15.5)
WBC: 5.4 10*3/uL (ref 4.0–10.5)
nRBC: 0 % (ref 0.0–0.2)

## 2021-05-02 LAB — COMPREHENSIVE METABOLIC PANEL
ALT: 59 U/L — ABNORMAL HIGH (ref 0–44)
AST: 60 U/L — ABNORMAL HIGH (ref 15–41)
Albumin: 3.4 g/dL — ABNORMAL LOW (ref 3.5–5.0)
Alkaline Phosphatase: 101 U/L (ref 38–126)
Anion gap: 9 (ref 5–15)
BUN: 9 mg/dL (ref 6–20)
CO2: 25 mmol/L (ref 22–32)
Calcium: 9.3 mg/dL (ref 8.9–10.3)
Chloride: 107 mmol/L (ref 98–111)
Creatinine, Ser: 0.61 mg/dL (ref 0.61–1.24)
GFR, Estimated: >60 mL/min (ref >60)
Glucose, Bld: 112 mg/dL — ABNORMAL HIGH (ref 70–99)
Potassium: 3.6 mmol/L (ref 3.5–5.1)
Sodium: 141 mmol/L (ref 135–145)
Total Bilirubin: 0.4 mg/dL (ref 0.3–1.2)
Total Protein: 7.9 g/dL (ref 6.5–8.1)

## 2021-05-02 LAB — LIPID PANEL
Cholesterol: 161 mg/dL (ref 0–200)
HDL: 54 mg/dL (ref >40)
LDL Cholesterol: 81 mg/dL (ref 0–99)
Total CHOL/HDL Ratio: 3 RATIO
Triglycerides: 131 mg/dL (ref <150)
VLDL: 26 mg/dL (ref 0–40)

## 2021-05-02 MED ORDER — MIRTAZAPINE 15 MG PO TABS: 15.0000 mg | ORAL_TABLET | Freq: Every day | ORAL | 0 refills | Status: DC

## 2021-05-02 MED ORDER — PANTOPRAZOLE SODIUM 40 MG PO TBEC: 40.0000 mg | DELAYED_RELEASE_TABLET | Freq: Every day | ORAL | 0 refills | Status: DC

## 2021-05-02 MED ORDER — NICOTINE POLACRILEX 2 MG MT GUM: 2.0000 mg | CHEWING_GUM | OROMUCOSAL | 0 refills | Status: DC | PRN

## 2021-05-02 MED ORDER — GABAPENTIN 300 MG PO CAPS: 300.0000 mg | ORAL_CAPSULE | Freq: Three times a day (TID) | ORAL | 0 refills | Status: DC

## 2021-05-02 MED ORDER — AMLODIPINE BESYLATE 10 MG PO TABS: 10.0000 mg | ORAL_TABLET | Freq: Every day | ORAL | 0 refills | Status: DC
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
## 2021-05-02 NOTE — Progress Notes (Signed)
RN met with pt and reviewed pt's discharge instructions.  Pt verbalized understanding of discharge instructions and pt did not have any questions. RN reviewed and provided pt with a copy of SRA, AVS and Transition Record.  RN returned pt's belongings to pt.  Paper prescriptions and medication samples were given to pt.  Pt denied SI/HI/AVH and voiced no concerns.  Pt was appreciative of the care pt received at [facility].  Patient discharged to the lobby without incident.  

## 2021-05-02 NOTE — [facility] Counselor (Signed)
CSW provided the patient with a packet of information that contained resources for: shelters and housing, free and reduced price food, free or reduced price clothing, [employer] cards, and suicide prevention.

## 2021-05-02 NOTE — Progress Notes (Signed)
  [facility] Adult Case Management Discharge Plan :  Will you be returning to the same living situation after discharge:  Yes,  Shelter  At discharge, do you have transportation home?: Yes,  Safe Transport  Do you have the ability to pay for your medications: Yes,  Community   Release of information consent forms completed and in the chart;  Patient's signature needed at discharge.  Patient to Follow up at:  Follow-up Information     [facility] Follow up.   Specialty: Addiction Medicine Why: A referral was sent to this facility on your behalf. Please call them to complete the pre-screen for substance use treatment. Contact information: [address] [place] [address] [phone]         [facility] Follow up.   Specialty: Urgent Care Why: Please go to this provider for therapy and medication management services during walk in hours:  Monday through Wednesday from 8:00 am to 11:00 am.  Services are provided on a first come, first served basis. Contact information: [address] [facility] [place] [address] [phone]        [facility] Follow up.   Why: You may go to this facility for primary care and lost cost medications. Contact information: [address] [place] [address] [phone]        [facility] Follow up.   Why: You may go to this facility Monday- Friday 6am-10am for intake for substance abuse intensive outpatient services.        [facility] Follow up.   Why: A referral was made to this facility on your behalf for substance use treatment. Please call daily for bed openings. Contact information: [address] [place] [address] [phone]                 Next level of care provider has access to [facility] Link:yes  Safety Planning and Suicide Prevention discussed:  Yes,  with patient      Has patient been referred to the Quitline?: Patient refused referral  Patient has been referred for addiction treatment: Yes  Aram Beecham, LCSWA 05/02/2021, 10:40 AM

## 2021-05-02 NOTE — BHH Suicide Risk Assessment (Signed)
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 The direct patient care time included face-to-face time with the patient, reviewing the patient's chart, communicating with other professionals, and coordinating care. Greater than 50% of this time was spent in counseling or coordinating care with the patient regarding goals of hospitalization, psycho-education, and discharge planning needs.  Subjective: Patient seen on rounds with Automotive engineer. Patient denies SI, HI, AVH, paranoia, or current signs of alcohol withdrawal. He reports stable sleep and good appetite. He denies medication side-effects and voices no physical complaints. He can articulate an aftercare and safety plan and requests discharge to a shelter today. He was encouraged to abstain from alcohol and to avoid excessive amounts of Tylenol after discharge. He was advised to have his LFTs rechecked by a PCP in the next 1-2 weeks for trending without fail.   Musculoskeletal: Strength & Muscle Tone: within normal limits Gait & Station: normal, steady Patient leans: N/A Psychiatric Specialty Exam: Physical Exam Vitals reviewed.  HENT:     Head: Normocephalic.  Pulmonary:     Effort: Pulmonary effort is normal.  Neurological:     General: No focal deficit present.     Mental Status: He is alert.    Review of Systems  Respiratory:  Negative for shortness of breath.   Cardiovascular:  Negative for chest pain.  Gastrointestinal:  Negative for nausea and vomiting.  Neurological:  Negative for  headaches.   Blood pressure 112/86, pulse 75, temperature 98 F (36.7 C), temperature source Oral, resp. rate 18, height 5\' 9"  (1.753 m), weight 71.2 kg, SpO2 98 %.Body mass index is 23.18 kg/m.  General Appearance: casually dressed, fair hygiene  Eye Contact:  Good  Speech:  Clear and Coherent and Normal Rate  Volume:  Normal  Mood:   described as good - appears euthymic  Affect:   moderate, stable, full  Thought Process:  Goal Directed and Linear  Orientation:  Full (Time, Place, and Person)  Thought Content:  Logical and denies AVH or paranoia  Suicidal Thoughts:  No  Homicidal Thoughts:  No  Memory:  Recent;   Good  Judgement:  Fair  Insight:  Fair  Psychomotor Activity:  Normal  Concentration:  Concentration: Good and Attention Span: Good  Recall:  Good  Fund of Knowledge:  Good  Language:  Good  Akathisia:  Negative  Assets:  Communication Skills Desire for Improvement Resilience  ADL's:  Intact  Cognition:  WNL  Sleep:  Number of Hours: 6.5   Mental Status Per Nursing Assessment::   On Admission:  Suicidal ideation indicated by patient, Self-harm thoughts  Demographic Factors:  Male, Caucasian, and Living alone  Loss Factors: Decrease in vocational status and Financial problems/change in socioeconomic status  Historical Factors: Previous psychiatric diagnoses/treatment  Risk Reduction Factors:   Sense of responsibility to family and Positive coping skills or problem solving skills  Continued Clinical Symptoms:  Alcohol/Substance Abuse/Dependencies  Cognitive Features That Contribute To Risk:  None    Suicide Risk:  Minimal: No identifiable suicidal ideation. may be classified as minimal risk based on the severity of the depressive symptoms  Follow-up Information     [facility] Follow up.   Specialty: Addiction Medicine Why: A referral was sent to this facility on your behalf. Please call them to complete the pre-screen  for substance use treatment. Contact information: [address] [place] [address] [phone]         [facility] Follow up.   Specialty: Urgent Care Why: Please go to this provider for therapy and medication management services during walk in hours:  Monday through Wednesday from 8:00 am to 11:00 am.  Services are provided on a first come, first served basis. Contact information: [address] [place] [address] [phone]        [facility] Follow up.   Why: You may go to this facility for primary care and lost cost medications. Contact information: [address] [place] [address] [phone]        [facility] Follow up.   Why: You may go to this facility Monday- Friday 6am-10am for intake for substance abuse intensive outpatient services.        [facility] Follow up.   Why: A referral was made to this facility on your behalf for substance use treatment. Please call daily for bed openings. Contact information: [address] [place] [address] [phone]                 Plan Of Care/Follow-up recommendations:  Activity:  as tolerated Diet:  heart healthy Other:  Patient advised to abstain from alcohol and illicit substances and to comply with medications and outpatient mental health/substance abuse follow up appointments. He was advised to avoid excessive amounts of Tylenol and to have his liver function panel rechecked by a primary care provider without fail in 1-2 weeks.   Comer Locket, MD, FAPA 05/02/2021, 10:06 AM

## 2021-05-02 NOTE — Progress Notes (Addendum)
[facility] MD Progress Note  05/02/2021 4:39 PM Trigger Frasier  MRN:  [id number] Subjective: Francisco Houston is a 53 YOM with a PPHx of PTSD, alcohol use disorder, and homelessness, presenting with depressed mood and suicidal ideation. On interview today, patient reports benefit from the trazodone, but overall still poor sleep. He continues to deny SI and AVH. He reports cravings for alcohol. When asked about how he plans to avoid alcohol outside of the hospital, he does not give a well developed plan and says that AA does not work for him. He is agreeable with the plan for him to go to [facility].  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 No pertinent surgical history. Family History: History reviewed. No pertinent family history.  Social History:  Social History   Substance and Sexual Activity  Alcohol Use Yes   Comment: heavily     Social History   Substance and Sexual Activity  Drug Use Yes   Types: Cocaine    Social History   Socioeconomic History   Marital status: Divorced    Spouse name: Not on file   Number of children: Not on file   Years of education: Not on file   Highest education level: Not on file  Occupational History   Not on file  Tobacco Use   Smoking status: Some Days    Pack years: 0.00   Smokeless tobacco: Never  Substance and Sexual Activity   Alcohol use: Yes    Comment: heavily    Drug use: Yes    Types: Cocaine   Sexual activity: Not on file  Other Topics Concern   Not on file  Social History Narrative   ** Merged History Encounter **       Social Determinants of Health   Financial Resource Strain: Not on file  Food Insecurity: Not on file  Transportation Needs: Not on file  Physical Activity: Not on file  Stress: Not on file  Social Connections: Not on file      Sleep: Poor  Appetite:  Good  Current Medications: Current Facility-Administered Medications  Medication Dose Route Frequency Provider Last Rate Last Admin   acetaminophen (TYLENOL) tablet 650 mg  650 mg Oral Q6H PRN Bobbitt, Shalon E, NP   650 mg at 05/01/21 2109   amLODipine (NORVASC) tablet 10 mg  10 mg Oral Daily Antonieta Pert, MD   10 mg at 05/02/21 3419   folic acid (FOLVITE) tablet 1 mg  1 mg Oral Daily Antonieta Pert, MD   1 mg at 05/02/21 3790   gabapentin (NEURONTIN) capsule 300 mg  300 mg Oral TID Antonieta Pert, MD   300 mg at 05/02/21 1154   levETIRAcetam (KEPPRA) tablet 250 mg  250 mg Oral BID Antonieta Pert, MD   250 mg at 05/02/21 2409   LORazepam (ATIVAN) tablet 1 mg  1 mg Oral Q6H PRN Antonieta Pert, MD   1 mg at 04/29/21 2117  magnesium hydroxide (MILK OF MAGNESIA) suspension 30 mL  30 mL Oral Daily PRN Antonieta Pert, MD       mirtazapine (REMERON) tablet 15 mg  15 mg Oral QHS Antonieta Pert, MD   15 mg at 05/01/21 2109   nicotine polacrilex (NICORETTE) gum 2 mg  2 mg Oral PRN Bobbitt, Shalon E, NP   2 mg at 05/02/21 0928   ondansetron (ZOFRAN) tablet 4 mg  4 mg Oral Q8H PRN Antonieta Pert, MD   4 mg at 04/29/21 2117   pantoprazole (PROTONIX) EC tablet 40 mg  40 mg Oral Daily Antonieta Pert, MD   40 mg at 05/02/21 6269   thiamine tablet 100 mg  100 mg Oral Daily Antonieta Pert, MD   100 mg at 05/02/21 4854   Or   thiamine (B-1) injection 100 mg  100 mg Intravenous Daily Antonieta Pert, MD       traZODone (DESYREL) tablet 50 mg   50 mg Oral QHS PRN Carlyn Reichert, MD   50 mg at 05/01/21 2109   Current Outpatient Medications  Medication Sig Dispense Refill   amLODipine (NORVASC) 10 MG tablet Take 1 tablet (10 mg total) by mouth daily. 30 tablet 0   gabapentin (NEURONTIN) 300 MG capsule Take 1 capsule (300 mg total) by mouth 3 (three) times daily. 90 capsule 0   mirtazapine (REMERON) 15 MG tablet Take 1 tablet (15 mg total) by mouth at bedtime. 30 tablet 0   nicotine polacrilex (NICORETTE) 2 MG gum Take 1 each (2 mg total) by mouth as needed for smoking cessation. 100 tablet 0   pantoprazole (PROTONIX) 40 MG tablet Take 1 tablet (40 mg total) by mouth daily. 30 tablet 0    Lab Results:  Results for orders placed or performed during the hospital encounter of 04/28/21 (from the past 48 hour(s))  Lipid panel     Status: None   Collection Time: 05/02/21  6:32 AM  Result Value Ref Range   Cholesterol 161 0 - 200 mg/dL   Triglycerides 627 <035 mg/dL   HDL 54 >00 mg/dL   Total CHOL/HDL Ratio 3.0 RATIO   VLDL 26 0 - 40 mg/dL   LDL Cholesterol 81 0 - 99 mg/dL    Comment:        Total Cholesterol/HDL:CHD Risk Coronary Heart Disease Risk Table                     Men   Women  1/2 Average Risk   3.4   3.3  Average Risk       5.0   4.4  2 X Average Risk   9.6   7.1  3 X Average Risk  23.4   11.0        Use the calculated Patient Ratio above and the CHD Risk Table to determine the patient's CHD Risk.        ATP III CLASSIFICATION (LDL):  <100     mg/dL   Optimal  938-182  mg/dL   Near or Above                    Optimal  130-159  mg/dL   Borderline  993-716  mg/dL   High  >967     mg/dL   Very High Performed at [facility], [address], [place], [place] [address]   CBC with Differential/Platelet     Status: Abnormal  Collection Time: 05/02/21  6:32 AM  Result Value Ref Range   WBC 5.4 4.0 - 10.5 K/uL   RBC 4.17 (L) 4.22 - 5.81 MIL/uL   Hemoglobin 14.2 13.0 - 17.0 g/dL   HCT 09.7 35.3  - 29.9 %   MCV 103.4 (H) 80.0 - 100.0 fL   MCH 34.1 (H) 26.0 - 34.0 pg   MCHC 32.9 30.0 - 36.0 g/dL   RDW 24.2 68.3 - 41.9 %   Platelets 170 150 - 400 K/uL   nRBC 0.0 0.0 - 0.2 %   Neutrophils Relative % 53 %   Neutro Abs 3.0 1.7 - 7.7 K/uL   Lymphocytes Relative 30 %   Lymphs Abs 1.6 0.7 - 4.0 K/uL   Monocytes Relative 11 %   Monocytes Absolute 0.6 0.1 - 1.0 K/uL   Eosinophils Relative 3 %   Eosinophils Absolute 0.1 0.0 - 0.5 K/uL   Basophils Relative 2 %   Basophils Absolute 0.1 0.0 - 0.1 K/uL   Immature Granulocytes 1 %   Abs Immature Granulocytes 0.03 0.00 - 0.07 K/uL    Comment: Performed at [facility], [address], [place], [place] [address]  Comprehensive metabolic panel     Status: Abnormal   Collection Time: 05/02/21  6:32 AM  Result Value Ref Range   Sodium 141 135 - 145 mmol/L   Potassium 3.6 3.5 - 5.1 mmol/L   Chloride 107 98 - 111 mmol/L   CO2 25 22 - 32 mmol/L   Glucose, Bld 112 (H) 70 - 99 mg/dL    Comment: Glucose reference range applies only to samples taken after fasting for at least 8 hours.   BUN 9 6 - 20 mg/dL   Creatinine, Ser 7.98 0.61 - 1.24 mg/dL   Calcium 9.3 8.9 - 92.1 mg/dL   Total Protein 7.9 6.5 - 8.1 g/dL   Albumin 3.4 (L) 3.5 - 5.0 g/dL   AST 60 (H) 15 - 41 U/L   ALT 59 (H) 0 - 44 U/L   Alkaline Phosphatase 101 38 - 126 U/L   Total Bilirubin 0.4 0.3 - 1.2 mg/dL   GFR, Estimated >19 >41 mL/min    Comment: (NOTE) Calculated using the CKD-EPI Creatinine Equation (2021)    Anion gap 9 5 - 15    Comment: Performed at [facility], [address], [place], [place] [address]    Blood Alcohol level:  Lab Results  Component Value Date   ETH 291 (H) 04/27/2021   ETH 241 (H) 03/13/2021    Metabolic Disorder Labs: Lab Results  Component Value Date   HGBA1C 5.9 (H) 02/24/2021   MPG 122.63 02/24/2021   No results found for: PROLACTIN Lab Results  Component Value Date   CHOL 161 05/02/2021    TRIG 131 05/02/2021   HDL 54 05/02/2021   CHOLHDL 3.0 05/02/2021   VLDL 26 05/02/2021   LDLCALC 81 05/02/2021   LDLCALC 122 (H) 02/24/2021    Physical Findings: MSK exam: elbow swelling and ROM improved compared to previous days CIWA:  CIWA-Ar Total: 2 COWS:     Musculoskeletal: Strength & Muscle Tone: within normal limits Gait & Station: normal Patient leans: N/A  Psychiatric Specialty Exam:  Presentation  General Appearance: Appropriate for Environment  Eye Contact:Good  Speech:Clear and Coherent  Speech Volume:Normal  Handedness:Right   Mood and Affect  Mood:Irritable  Affect:Congruent   Thought Process  Thought Processes:Coherent  Descriptions of Associations:Intact  Orientation:Full (Time, Place and  Person)  Thought Content:Logical  History of Schizophrenia/Schizoaffective disorder:No  Duration of Psychotic Symptoms:No data recorded Hallucinations:Hallucinations: None  Ideas of Reference:None  Suicidal Thoughts:Denied  Homicidal Thoughts:Homicidal Thoughts: No   Sensorium  Memory:Immediate Good; Recent Good; Remote Good  Judgment:Poor  Insight:Fair   Executive Functions  Concentration:Fair  Attention Span:Fair  Recall:Fair  Fund of Knowledge:Fair  Language:Fair   Psychomotor Activity  Psychomotor Activity:Psychomotor Activity: Normal   Assets  Assets:Communication Skills; Desire for Improvement; Physical Health   Sleep  Sleep:Sleep: Poor    Physical Exam: See above ROS negative for CP, SOB, HA, N/V/GI upset Blood pressure 112/86, pulse 75, temperature 98 F (36.7 C), temperature source Oral, resp. rate 18, height 5\' 9"  (1.753 m), weight 71.2 kg, SpO2 98 %. Body mass index is 23.18 kg/m.  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

## 2021-05-03 LAB — HEMOGLOBIN A1C
Hgb A1c MFr Bld: 5.6 % (ref 4.8–5.6)
Mean Plasma Glucose: 114 mg/dL

## 2021-05-15 ENCOUNTER — Other Ambulatory Visit: Payer: Self-pay

## 2021-05-15 ENCOUNTER — Emergency Department (HOSPITAL_COMMUNITY)
Admission: EM | Admit: 2021-05-15 | Discharge: 2021-05-17 | Disposition: A | Discharge status: Home / Self Care | Payer: Self-pay | Attending: Emergency Medicine | Admitting: Emergency Medicine

## 2021-05-15 ENCOUNTER — Encounter (HOSPITAL_COMMUNITY): Payer: Self-pay

## 2021-05-15 DIAGNOSIS — F10229 Alcohol dependence with intoxication, unspecified: Secondary | ICD-10-CM | POA: Diagnosis present

## 2021-05-15 DIAGNOSIS — F1910 Other psychoactive substance abuse, uncomplicated: Secondary | ICD-10-CM

## 2021-05-15 DIAGNOSIS — R45851 Suicidal ideations: Secondary | ICD-10-CM | POA: Insufficient documentation

## 2021-05-15 DIAGNOSIS — F17200 Nicotine dependence, unspecified, uncomplicated: Secondary | ICD-10-CM | POA: Insufficient documentation

## 2021-05-15 DIAGNOSIS — Z20822 Contact with and (suspected) exposure to covid-19: Secondary | ICD-10-CM | POA: Insufficient documentation

## 2021-05-15 DIAGNOSIS — Y908 Blood alcohol level of 240 mg/100 ml or more: Secondary | ICD-10-CM | POA: Insufficient documentation

## 2021-05-15 DIAGNOSIS — F1024 Alcohol dependence with alcohol-induced mood disorder: Secondary | ICD-10-CM | POA: Insufficient documentation

## 2021-05-15 DIAGNOSIS — E119 Type 2 diabetes mellitus without complications: Secondary | ICD-10-CM | POA: Insufficient documentation

## 2021-05-15 LAB — CBC WITH DIFFERENTIAL/PLATELET
Abs Immature Granulocytes: 0.02 10*3/uL (ref 0.00–0.07)
Basophils Absolute: 0.1 10*3/uL (ref 0.0–0.1)
Basophils Relative: 2 %
Eosinophils Absolute: 0.1 10*3/uL (ref 0.0–0.5)
Eosinophils Relative: 2 %
HCT: 43.6 % (ref 39.0–52.0)
Hemoglobin: 14.9 g/dL (ref 13.0–17.0)
Immature Granulocytes: 0 %
Lymphocytes Relative: 33 %
Lymphs Abs: 1.6 10*3/uL (ref 0.7–4.0)
MCH: 34.3 pg — ABNORMAL HIGH (ref 26.0–34.0)
MCHC: 34.2 g/dL (ref 30.0–36.0)
MCV: 100.5 fL — ABNORMAL HIGH (ref 80.0–100.0)
Monocytes Absolute: 0.5 10*3/uL (ref 0.1–1.0)
Monocytes Relative: 11 %
Neutro Abs: 2.5 10*3/uL (ref 1.7–7.7)
Neutrophils Relative %: 52 %
Platelets: 184 10*3/uL (ref 150–400)
RBC: 4.34 MIL/uL (ref 4.22–5.81)
RDW: 13.7 % (ref 11.5–15.5)
WBC: 4.8 10*3/uL (ref 4.0–10.5)
nRBC: 0 % (ref 0.0–0.2)

## 2021-05-15 LAB — COMPREHENSIVE METABOLIC PANEL
ALT: 48 U/L — ABNORMAL HIGH (ref 0–44)
AST: 55 U/L — ABNORMAL HIGH (ref 15–41)
Albumin: 3.7 g/dL (ref 3.5–5.0)
Alkaline Phosphatase: 114 U/L (ref 38–126)
Anion gap: 14 (ref 5–15)
BUN: <5 mg/dL — ABNORMAL LOW (ref 6–20)
CO2: 21 mmol/L — ABNORMAL LOW (ref 22–32)
Calcium: 8.9 mg/dL (ref 8.9–10.3)
Chloride: 103 mmol/L (ref 98–111)
Creatinine, Ser: 0.78 mg/dL (ref 0.61–1.24)
GFR, Estimated: >60 mL/min (ref >60)
Glucose, Bld: 84 mg/dL (ref 70–99)
Potassium: 3.2 mmol/L — ABNORMAL LOW (ref 3.5–5.1)
Sodium: 138 mmol/L (ref 135–145)
Total Bilirubin: 0.5 mg/dL (ref 0.3–1.2)
Total Protein: 7.8 g/dL (ref 6.5–8.1)

## 2021-05-15 LAB — SALICYLATE LEVEL: Salicylate Lvl: <7 mg/dL — ABNORMAL LOW (ref 7.0–30.0)

## 2021-05-15 LAB — ACETAMINOPHEN LEVEL: Acetaminophen (Tylenol), Serum: <10 ug/mL — ABNORMAL LOW (ref 10–30)

## 2021-05-15 LAB — RESP PANEL BY RT-PCR (FLU A&B, COVID) ARPGX2
Influenza A by PCR: NEGATIVE
Influenza B by PCR: NEGATIVE
SARS Coronavirus 2 by RT PCR: NEGATIVE

## 2021-05-15 LAB — ETHANOL: Alcohol, Ethyl (B): 320 mg/dL (ref <10)

## 2021-05-15 MED ORDER — POTASSIUM CHLORIDE CRYS ER 20 MEQ PO TBCR
40.0000 meq | EXTENDED_RELEASE_TABLET | Freq: Once | ORAL | Status: AC
  Administered 2021-05-15: 40 meq via ORAL
  Filled 2021-05-15: qty 2

## 2021-05-15 MED ORDER — LORAZEPAM 1 MG PO TABS
0.0000 mg | ORAL_TABLET | Freq: Four times a day (QID) | ORAL | Status: DC
  Administered 2021-05-15: 3 mg via ORAL
  Administered 2021-05-15 – 2021-05-16 (×4): 1 mg via ORAL
  Filled 2021-05-15 (×3): qty 1
  Filled 2021-05-15: qty 3
  Filled 2021-05-15 (×2): qty 1

## 2021-05-15 MED ORDER — THIAMINE HCL 100 MG PO TABS
100.0000 mg | ORAL_TABLET | Freq: Every day | ORAL | Status: DC
  Administered 2021-05-15 – 2021-05-17 (×3): 100 mg via ORAL
  Filled 2021-05-15 (×3): qty 1

## 2021-05-15 MED ORDER — LORAZEPAM 2 MG/ML IJ SOLN
0.0000 mg | Freq: Two times a day (BID) | INTRAMUSCULAR | Status: DC
Start: 2021-05-18 — End: 2021-05-17

## 2021-05-15 MED ORDER — THIAMINE HCL 100 MG/ML IJ SOLN: 100.0000 mg | Freq: Every day | INTRAMUSCULAR | Status: DC

## 2021-05-15 MED ORDER — LORAZEPAM 2 MG/ML IJ SOLN
0.0000 mg | Freq: Four times a day (QID) | INTRAMUSCULAR | Status: DC
  Administered 2021-05-17: 1 mg via INTRAVENOUS

## 2021-05-15 MED ORDER — LORAZEPAM 1 MG PO TABS: 0.0000 mg | ORAL_TABLET | Freq: Two times a day (BID) | ORAL | Status: DC

## 2021-05-15 NOTE — ED Notes (Signed)
Patient changed out into burgandy scrubs. 1 Patient belonging bag, jeans, shirt, shoes, hat. One book bag. Both placed at the nurses station across from room 18.
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
## 2021-05-15 NOTE — ED Notes (Signed)
Patient wanded by security. 

## 2021-05-15 NOTE — ED Notes (Signed)
Patients belonging bag and backpack are moved to [employer] [address]. Knife is locked up with security.

## 2021-05-15 NOTE — ED Provider Notes (Signed)
[facility] Provider Note   CSN: [id number] Arrival date & time: 05/15/21  1855     History Chief Complaint  Patient presents with   Suicidal    Francisco Houston is a 45 y.o. male.  The history is provided by the patient and medical records.  Francisco Houston is a 45 y.o. male who presents to the Emergency Department complaining of SI and alcohol problems. He presents the emergency department complaining of suicidal ideation. He brought himself here voluntarily. He states that he is tired of living the way he is living. He currently drinks beer as soon as he wakes up. He is living in a tent behind [place]. He states that a year ago he made $265,000 a year. He states that he has not seen his daughter in eight months. When asked if he has a plan he states "what you think?" He denies any recent illnesses. He does have diarrhea every time he wakes up in the morning and is shaking every time he wakes up. He has been in alcohol treatment in the past.     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 No pertinent surgical history.     History reviewed. No pertinent family history.  Social History   Tobacco Use   Smoking status: Some Days   Smokeless tobacco: Never  Substance Use Topics   Alcohol use: Yes    Comment: heavily   Drug use: Yes    Types: Cocaine    Home Medications Prior to Admission medications   Medication  Sig Start Date End Date Taking? Authorizing Provider  amLODipine (NORVASC) 10 MG tablet Take 1 tablet (10 mg total) by mouth daily. 05/02/21   Lauro Franklin, MD  gabapentin (NEURONTIN) 300 MG capsule Take 1 capsule (300 mg total) by mouth 3 (three) times daily. 05/02/21 06/01/21  Lauro Franklin, MD  mirtazapine (REMERON) 15 MG tablet Take 1 tablet (15 mg total) by mouth at bedtime. 05/02/21 06/01/21  Lauro Franklin, MD  nicotine polacrilex (NICORETTE) 2 MG gum Take 1 each (2 mg total) by mouth as needed for smoking cessation. 05/02/21   Lauro Franklin, MD  pantoprazole (PROTONIX) 40 MG tablet Take 1 tablet (40 mg total) by mouth daily. 05/02/21 06/01/21  Lauro Franklin, MD    Allergies    Patient has no known allergies.  Review of Systems   Review of Systems  All other systems reviewed and are negative.  Physical Exam Updated Vital Signs Ht 5\' 9"  (1.753 m)   Wt 71.2 kg   BMI 23.18 kg/m   Physical Exam Vitals and nursing note reviewed.  Constitutional:      Appearance: He is well-developed.  HENT:     Head: Normocephalic and atraumatic.  Cardiovascular:     Rate and Rhythm: Normal rate and regular rhythm.  Pulmonary:     Effort: Pulmonary effort is normal. No respiratory distress.  Musculoskeletal:  General: No tenderness.  Skin:    General: Skin is warm and dry.  Neurological:     Mental Status: He is alert and oriented to person, place, and time.  Psychiatric:     Comments: Depressed mood and affect    ED Results / Procedures / Treatments   Labs (all labs ordered are listed, but only abnormal results are displayed) Labs Reviewed  COMPREHENSIVE METABOLIC PANEL  CBC WITH DIFFERENTIAL/PLATELET  ACETAMINOPHEN LEVEL  SALICYLATE LEVEL  ETHANOL    EKG None  Radiology No results found.  Procedures Procedures   Medications Ordered in ED Medications  LORazepam (ATIVAN) injection 0-4 mg (has no administration in time range)    Or   LORazepam (ATIVAN) tablet 0-4 mg (has no administration in time range)  LORazepam (ATIVAN) injection 0-4 mg (has no administration in time range)    Or  LORazepam (ATIVAN) tablet 0-4 mg (has no administration in time range)  thiamine tablet 100 mg (has no administration in time range)    Or  thiamine (B-1) injection 100 mg (has no administration in time range)    ED Course  I have reviewed the triage vital signs and the nursing notes.  Pertinent labs & imaging results that were available during my care of the patient were reviewed by me and considered in my medical decision making (see chart for details).    MDM Rules/Calculators/A&P                         patient with history of alcohol abuse presents himself to the emergency department voluntarily for suicidal ideation. On my evaluation he does not state a plan. He did state to nursing that he he would cut his throat with a knife. He did have a knife on his person. BMP with mild hypokalemia. Alcohol level is elevated. He does not appear to be acutely intoxicated or an acute alcohol withdrawal at this time. CIWA protocol started and his potassium was replaced orally. He has been medically cleared for psychiatric evaluation and treatment. Due to patient's threat of self harm to nursing staff IVC completed for patient safety.  Final Clinical Impression(s) / ED Diagnoses Final diagnoses:  None    Rx / DC Orders ED Discharge Orders     None        Tilden Fossa, MD 05/15/21 2116

## 2021-05-15 NOTE — ED Triage Notes (Signed)
Patient presents with suicidal thoughts. He stated "I dont wanna be here anymore." Patient said that he wants to put a knife to this throat. He has had suicidal thoughts before 2 weeks ago.

## 2021-05-16 NOTE — BH Assessment (Signed)
Nira Conn, PA-C at [facility] says [facility]UC is at capacity. Recommendation is for Pt to be observed in ED and evaluated by psychiatry in the morning. Notified Dr Kennis Carina and [name], RN of recommendation.   Pamalee Leyden, [facility], [facility] Triage Specialist [phone]
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
## 2021-05-16 NOTE — ED Provider Notes (Signed)
Emergency Medicine Observation Re-evaluation Note  Francisco Houston is a 45 y.o. male, seen on rounds today.  Pt initially presented to the ED for complaints of Suicidal Currently, the patient is sleeping.  Physical Exam  BP 121/86 (BP Location: Right Arm)   Pulse 74   Temp 98.7 F (37.1 C) (Oral)   Resp 16   Ht 5\' 9"  (1.753 m)   Wt 71.2 kg   SpO2 97%   BMI 23.18 kg/m  Physical Exam General: No distress, awakens easily Cardiac: Regular rate and rhythm Lungs: No increased work of breathing Psych: Complained of suicidal ideation  ED Course / MDM  EKG:   I have reviewed the labs performed to date as well as medications administered while in observation.  Recent changes in the last 24 hours include none.  Plan  Current plan is for behavioral health evaluation this morning to facilitate possible placement. Patient is under full IVC at this time.   , MD 05/16/21 [phone]

## 2021-05-17 MED ORDER — NICOTINE POLACRILEX 2 MG MT GUM: 2.0000 mg | CHEWING_GUM | OROMUCOSAL | Status: DC | PRN

## 2021-05-17 NOTE — BH Assessment (Signed)
[facility]HH Assessment Progress Note   Per Dorena Bodo, NP, this pt does not require psychiatric hospitalization at this time.  Pt presents under IVC initiated by EDP Tilden Fossa, MD which has been rescinded by Nelly Rout, MD.  Pt is psychiatrically cleared.  Discharge instructions include referral information for area substance abuse treatment providers.  EDP Arby Barrette, MD and pt's nurse, Gabriel Earing, have been notified.  Doylene Canning, MA Triage Specialist [phone]

## 2021-05-17 NOTE — ED Notes (Signed)
Pt resting with eyes closed. Sitter outside of bedside. 1:1. Per nightshift nurse, pt's last CIWA score was completed at 2200 last night, 05/16/21, d/t pt currently sleeping. Will reassess once pt is awake this morning.

## 2021-05-17 NOTE — ED Notes (Signed)
Report received from Gia B., RN.  

## 2021-05-17 NOTE — Discharge Instructions (Addendum)
To help you maintain a sober lifestyle, a substance abuse treatment program may be beneficial to you.  Contact one of the following providers at your earliest opportunity to ask about enrolling their program:  RESIDENTIAL PROGRAMS:       [facility]      [address] [place], [place] [address]      [phone]       [facility]      [address]      [place], [place] [address]      [phone]       [facility]      [address] [place]      [place], [place] [address]      [phone]       [facility]      [address] [place], [place] [address]      [phone]       [facility]      [address] [place], [place] [address]      [phone]       [address]      [address] [place] [address]      [phone]       [facility]      [address]      [place], [place] [address]      [phone]       [facility]      [phone]       [facility]      [address]      [place], [place] [address]      [phone]       [facility]      [phone]        OUTPATIENT PROGRAMS:       [facility]      [address]      [place], [place] [address]      [phone]      Ask about their Substance Abuse Intensive Outpatient Program.  They also offer psychiatry/medication management and therapy.  New patients are seen in their walk-in clinic.  Walk-in hours are Monday - Thursday from 8:00 am - 11:00 am for psychiatry, and Friday from 1:00 pm - 4:00 pm for therapy.  Walk-in patients are seen on a first come, first served basis, so try to arrive as early as possible for the best chance of being seen the same day.

## 2021-05-17 NOTE — ED Provider Notes (Signed)
Emergency Medicine Observation Re-evaluation Note  Kyzer Blowe is a 45 y.o. male, seen on rounds today.  Pt initially presented to the ED for complaints of Suicidal Currently, the patient is by psychiatry for discharge.  Physical Exam  BP (!) 130/100   Pulse 83   Temp 98.2 F (36.8 C) (Oral)   Resp 20   Ht 5\' 9"  (1.753 m)   Wt 71.2 kg   SpO2 100%   BMI 23.18 kg/m  Physical Exam General: Alert, no confusion no respiratory distress Cardiac: Regular no rub murmur gallop Lungs: Clear to auscultation Psych: Mental status clear.  No signs of confusion, appropriately interactive.  ED Course / MDM  EKG:   I have reviewed the labs performed to date as well as medications administered while in observation.  Recent changes in the last 24 hours include cleared for discharge.  Plan  Current plan is for rescind IVC and discharge.  Giovanni Biby is not under involuntary commitment.     [name], MD 05/17/21 1154

## 2021-05-17 NOTE — ED Notes (Signed)
Patient is currently sleeping. Proper CIWA can not be done at this time. Will readdress once patient wakes up

## 2021-05-17 NOTE — ED Notes (Signed)
Pt sleeping comfortably at this time. Resps even and unlabored. VS deferred at this time in order to promote optimal sleeping pattern and to allow for rest. WCTM.
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
## 2021-05-19 ENCOUNTER — Emergency Department (HOSPITAL_COMMUNITY)
Admission: EM | Admit: 2021-05-19 | Discharge: 2021-05-19 | Disposition: A | Discharge status: Home / Self Care | Payer: Self-pay | Attending: Student | Admitting: Student

## 2021-05-19 ENCOUNTER — Emergency Department (HOSPITAL_COMMUNITY): Payer: Self-pay

## 2021-05-19 ENCOUNTER — Other Ambulatory Visit: Payer: Self-pay

## 2021-05-19 DIAGNOSIS — R0602 Shortness of breath: Secondary | ICD-10-CM | POA: Insufficient documentation

## 2021-05-19 DIAGNOSIS — R110 Nausea: Secondary | ICD-10-CM | POA: Insufficient documentation

## 2021-05-19 DIAGNOSIS — R079 Chest pain, unspecified: Secondary | ICD-10-CM | POA: Insufficient documentation

## 2021-05-19 DIAGNOSIS — Z5321 Procedure and treatment not carried out due to patient leaving prior to being seen by health care provider: Secondary | ICD-10-CM | POA: Insufficient documentation

## 2021-05-19 DIAGNOSIS — R42 Dizziness and giddiness: Secondary | ICD-10-CM | POA: Insufficient documentation

## 2021-05-19 LAB — TROPONIN I (HIGH SENSITIVITY): Troponin I (High Sensitivity): 6 ng/L (ref <18)

## 2021-05-19 LAB — BASIC METABOLIC PANEL
Anion gap: 14 (ref 5–15)
BUN: 5 mg/dL — ABNORMAL LOW (ref 6–20)
CO2: 17 mmol/L — ABNORMAL LOW (ref 22–32)
Calcium: 8.4 mg/dL — ABNORMAL LOW (ref 8.9–10.3)
Chloride: 100 mmol/L (ref 98–111)
Creatinine, Ser: 0.71 mg/dL (ref 0.61–1.24)
GFR, Estimated: >60 mL/min (ref >60)
Glucose, Bld: 131 mg/dL — ABNORMAL HIGH (ref 70–99)
Potassium: 2.9 mmol/L — ABNORMAL LOW (ref 3.5–5.1)
Sodium: 131 mmol/L — ABNORMAL LOW (ref 135–145)

## 2021-05-19 LAB — CBC
HCT: 42.8 % (ref 39.0–52.0)
Hemoglobin: 14.8 g/dL (ref 13.0–17.0)
MCH: 34.7 pg — ABNORMAL HIGH (ref 26.0–34.0)
MCHC: 34.6 g/dL (ref 30.0–36.0)
MCV: 100.2 fL — ABNORMAL HIGH (ref 80.0–100.0)
Platelets: 154 10*3/uL (ref 150–400)
RBC: 4.27 MIL/uL (ref 4.22–5.81)
RDW: 12.9 % (ref 11.5–15.5)
WBC: 8.3 10*3/uL (ref 4.0–10.5)
nRBC: 0 % (ref 0.0–0.2)

## 2021-05-19 NOTE — ED Provider Notes (Signed)
Emergency Medicine Provider Triage Evaluation Note  Francisco Houston , a 45 y.o. male  was evaluated in triage.  Pt complains of chest pain.  Was seen 2 days ago for suicidal ideation.  States chest pain started earlier today was associated with nausea, no vomiting.  Pain was alleviated with nitroglycerin.  History of 3 previous heart attacks.  States it feels like that.  He has a history of high blood pressure..  Review of Systems  Positive: Chest pain, shortness of breath, nausea Negative: Leg swelling  Physical Exam  There were no vitals taken for this visit. Gen:   Awake, no distress   Resp:  Normal effort  MSK:   Moves extremities without difficulty  Other:    Medical Decision Making  Medically screening exam initiated at 8:46 PM.  Appropriate orders placed.  Francisco Houston was informed that the remainder of the evaluation will be completed by another provider, this initial triage assessment does not replace that evaluation, and the importance of remaining in the ED until their evaluation is complete.     Theron Arista, PA-C 05/19/21 2047    Dione Booze, MD 05/22/21 [phone]

## 2021-05-19 NOTE — ED Notes (Signed)
Pt left after being verbally aggressive with staff cursing and yelling. This tech let triage nurse know pt is wanting to leave tech was told to advise pt that it was his own ris. This tech took pt IV out and advised pt as so. Pt left

## 2021-05-19 NOTE — ED Triage Notes (Addendum)
Pt BIB [facility] c/o acute onset of chest pain associated with SOB Lightheaded and nausea. Given 1 NTG in routine no ASA without relief of S/S. Pt states he has HX 3 MI in the past, no history noted in chart. Endorses ETOH

## 2021-05-20 ENCOUNTER — Encounter (HOSPITAL_COMMUNITY): Payer: Self-pay | Admitting: Emergency Medicine

## 2021-05-20 ENCOUNTER — Other Ambulatory Visit: Payer: Self-pay

## 2021-05-20 ENCOUNTER — Emergency Department (HOSPITAL_COMMUNITY)
Admission: EM | Admit: 2021-05-20 | Discharge: 2021-05-21 | Disposition: A | Discharge status: Home / Self Care | Payer: Self-pay | Attending: Emergency Medicine | Admitting: Emergency Medicine

## 2021-05-20 ENCOUNTER — Emergency Department (HOSPITAL_COMMUNITY): Payer: Self-pay

## 2021-05-20 ENCOUNTER — Emergency Department (HOSPITAL_COMMUNITY)
Admission: EM | Admit: 2021-05-20 | Discharge: 2021-05-20 | Disposition: A | Discharge status: Home / Self Care | Payer: Self-pay | Attending: Emergency Medicine | Admitting: Emergency Medicine

## 2021-05-20 DIAGNOSIS — Z79899 Other long term (current) drug therapy: Secondary | ICD-10-CM | POA: Insufficient documentation

## 2021-05-20 DIAGNOSIS — F10988 Alcohol use, unspecified with other alcohol-induced disorder: Secondary | ICD-10-CM | POA: Insufficient documentation

## 2021-05-20 DIAGNOSIS — Y908 Blood alcohol level of 240 mg/100 ml or more: Secondary | ICD-10-CM | POA: Insufficient documentation

## 2021-05-20 DIAGNOSIS — E119 Type 2 diabetes mellitus without complications: Secondary | ICD-10-CM | POA: Insufficient documentation

## 2021-05-20 DIAGNOSIS — F17200 Nicotine dependence, unspecified, uncomplicated: Secondary | ICD-10-CM | POA: Insufficient documentation

## 2021-05-20 DIAGNOSIS — R079 Chest pain, unspecified: Secondary | ICD-10-CM

## 2021-05-20 DIAGNOSIS — R0789 Other chest pain: Secondary | ICD-10-CM | POA: Insufficient documentation

## 2021-05-20 LAB — CBC WITH DIFFERENTIAL/PLATELET
Abs Immature Granulocytes: 0.02 10*3/uL (ref 0.00–0.07)
Basophils Absolute: 0.1 10*3/uL (ref 0.0–0.1)
Basophils Relative: 1 %
Eosinophils Absolute: 0.1 10*3/uL (ref 0.0–0.5)
Eosinophils Relative: 2 %
HCT: 42.4 % (ref 39.0–52.0)
Hemoglobin: 15.1 g/dL (ref 13.0–17.0)
Immature Granulocytes: 0 %
Lymphocytes Relative: 34 %
Lymphs Abs: 2.5 10*3/uL (ref 0.7–4.0)
MCH: 35.4 pg — ABNORMAL HIGH (ref 26.0–34.0)
MCHC: 35.6 g/dL (ref 30.0–36.0)
MCV: 99.3 fL (ref 80.0–100.0)
Monocytes Absolute: 0.7 10*3/uL (ref 0.1–1.0)
Monocytes Relative: 10 %
Neutro Abs: 3.9 10*3/uL (ref 1.7–7.7)
Neutrophils Relative %: 53 %
Platelets: 173 10*3/uL (ref 150–400)
RBC: 4.27 MIL/uL (ref 4.22–5.81)
RDW: 13.2 % (ref 11.5–15.5)
WBC: 7.4 10*3/uL (ref 4.0–10.5)
nRBC: 0 % (ref 0.0–0.2)

## 2021-05-20 LAB — LIPASE, BLOOD: Lipase: 78 U/L — ABNORMAL HIGH (ref 11–51)

## 2021-05-20 LAB — COMPREHENSIVE METABOLIC PANEL
ALT: 46 U/L — ABNORMAL HIGH (ref 0–44)
AST: 53 U/L — ABNORMAL HIGH (ref 15–41)
Albumin: 3.8 g/dL (ref 3.5–5.0)
Alkaline Phosphatase: 104 U/L (ref 38–126)
Anion gap: 12 (ref 5–15)
BUN: <5 mg/dL — ABNORMAL LOW (ref 6–20)
CO2: 21 mmol/L — ABNORMAL LOW (ref 22–32)
Calcium: 8.6 mg/dL — ABNORMAL LOW (ref 8.9–10.3)
Chloride: 102 mmol/L (ref 98–111)
Creatinine, Ser: 0.7 mg/dL (ref 0.61–1.24)
GFR, Estimated: >60 mL/min (ref >60)
Glucose, Bld: 98 mg/dL (ref 70–99)
Potassium: 3.6 mmol/L (ref 3.5–5.1)
Sodium: 135 mmol/L (ref 135–145)
Total Bilirubin: 0.3 mg/dL (ref 0.3–1.2)
Total Protein: 7.8 g/dL (ref 6.5–8.1)

## 2021-05-20 LAB — RAPID URINE DRUG SCREEN, HOSP PERFORMED
Amphetamines: NOT DETECTED
Barbiturates: NOT DETECTED
Benzodiazepines: NOT DETECTED
Cocaine: NOT DETECTED
Opiates: NOT DETECTED
Tetrahydrocannabinol: NOT DETECTED

## 2021-05-20 LAB — TROPONIN I (HIGH SENSITIVITY): Troponin I (High Sensitivity): 6 ng/L (ref <18)

## 2021-05-20 LAB — ETHANOL: Alcohol, Ethyl (B): 380 mg/dL (ref <10)

## 2021-05-20 NOTE — ED Triage Notes (Signed)
Patient states he is going through alcohol withdrawal, endorses chest pain. Last drink was before police brought him to the ED.

## 2021-05-20 NOTE — ED Provider Notes (Signed)
Emergency Medicine Provider Triage Evaluation Note  Francisco Houston , a 45 y.o. male  was evaluated in triage.  Pt complains of chest pain and withdrawal.  Patient reports chest pain began this morning, substernal, better if he drinks alcohol. No specific aggravating factors. Last drink shortly PTA. States he vomits every day.   Review of Systems  Positive: Chest pain, N/V Negative: Melena, hematemesis, fever  Physical Exam  BP 115/80 (BP Location: Left Arm)   Pulse 85   Temp 97.8 F (36.6 C) (Oral)   Resp 19   SpO2 96%  Gen:   Awake, no distress   Resp:  Normal effort  MSK:   Moves extremities without difficulty  Other:  Speech mildly slurred. Heart RRR, no focal abdominal tenderness.   Medical Decision Making  Medically screening exam initiated at 5:13 PM.  Appropriate orders placed.  Francisco Houston was informed that the remainder of the evaluation will be completed by another provider, this initial triage assessment does not replace that evaluation, and the importance of remaining in the ED until their evaluation is complete.  Chest pain   Cherly Anderson, PA-C 05/20/21 1715    Ernie Avena, MD 05/20/21 Izola Price

## 2021-05-21 NOTE — ED Notes (Signed)
Pt unable to sign for discharge due to placement in hall bed.

## 2021-05-21 NOTE — ED Provider Notes (Signed)
[facility]-EMERGENCY DEPT [facility] Emergency Department Provider Note MRN:  [id number]  Arrival date & time: 05/21/21     Chief Complaint   Chest pain History of Present Illness   Francisco Houston is a 45 y.o. year-old male with a history of diabetes, alcohol use disorder presenting to the ED with chief complaint of chest pain.  Location: Center of the chest Duration: 24 hours ago Onset: Sudden Timing: Constant Description: Sharp Severity: Moderate Exacerbating/Alleviating Factors: Improved with alcohol Associated Symptoms: None Pertinent Negatives: Denies dizziness or diaphoresis, no nausea vomiting, no trouble breathing   Review of Systems  A complete 10 system review of systems was obtained and all systems are negative except as noted in the HPI and PMH.   Patient's Health History    Past Medical History:  Diagnosis Date   Alcohol intoxication (HCC)    Diabetes mellitus without complication (HCC)    Homelessness    PTSD (post-traumatic stress disorder)     History reviewed. No pertinent surgical history.  No family history on file.  Social History   Socioeconomic History   Marital status: Divorced    Spouse name: Not on file   Number of children: Not on file   Years of education: Not on file   Highest education level: Not on file  Occupational History   Not on file  Tobacco Use   Smoking status: Some Days   Smokeless tobacco: Never  Substance and Sexual Activity   Alcohol use: Yes    Comment: heavily   Drug use: Yes    Types: Cocaine   Sexual activity: Not on file  Other Topics Concern   Not on file  Social History Narrative   ** Merged History Encounter **       Social Determinants of Health   Financial Resource Strain: Not on file  Food Insecurity: Not on file  Transportation Needs: Not on file  Physical Activity: Not on file  Stress: Not on file  Social Connections: Not on file  Intimate Partner Violence: Not on file     Physical Exam    Vitals:   05/20/21 2026 05/21/21 0024  BP: 133/81 124/80  Pulse: 98 65  Resp: 16 20  Temp:    SpO2: 96% 95%    CONSTITUTIONAL: Well-appearing, NAD NEURO:  Alert and oriented x 3, no focal deficits EYES:  eyes equal and reactive ENT/NECK:  no LAD, no JVD CARDIO: Regular rate, well-perfused, normal S1 and S2 PULM:  CTAB no wheezing or rhonchi GI/GU:  normal bowel sounds, non-distended, non-tender MSK/SPINE:  No gross deformities, no edema SKIN:  no rash, atraumatic PSYCH:  Appropriate speech and behavior  *Additional and/or pertinent findings included in MDM below  Diagnostic and Interventional Summary    EKG Interpretation  Date/Time:  Saturday May 20 2021 17:08:47 EDT Ventricular Rate:  86 PR Interval:  156 QRS Duration: 80 QT Interval:  360 QTC Calculation: 430 R Axis:   95 Text Interpretation: Sinus rhythm with Fusion complexes and Premature atrial complexes with Abberant conduction Rightward axis Borderline ECG Confirmed by Kennis Carina [id number]) on 05/21/2021 12:37:14 AM       Labs Reviewed  COMPREHENSIVE METABOLIC PANEL - Abnormal; Notable for the following components:      Result Value   CO2 21 (*)    BUN <5 (*)    Calcium 8.6 (*)    AST 53 (*)    ALT 46 (*)    All other components within normal limits  CBC WITH  DIFFERENTIAL/PLATELET - Abnormal; Notable for the following components:   MCH 35.4 (*)    All other components within normal limits  LIPASE, BLOOD - Abnormal; Notable for the following components:   Lipase 78 (*)    All other components within normal limits  ETHANOL - Abnormal; Notable for the following components:   Alcohol, Ethyl (B) 380 (*)    All other components within normal limits  RAPID URINE DRUG SCREEN, HOSP PERFORMED  TROPONIN I (HIGH SENSITIVITY)    DG Chest 2 View  Final Result      Medications - No data to display   Procedures  /  Critical Care Procedures  ED Course and Medical Decision Making  I have reviewed the  triage vital signs, the nursing notes, and pertinent available records from the EMR.  Listed above are laboratory and imaging tests that I personally ordered, reviewed, and interpreted and then considered in my medical decision making (see below for details).  Atypical chest pain, reassuring vital signs, sitting comfortably, sleeping.  EKG is without ischemic features.  Troponin is negative, minimally elevated lipase and LFTs consistent with alcohol use disorder.  No emergent process, appropriate for discharge.  No evidence of DVT or PE.       Elmer Sow. Pilar Plate, MD [facility] Emergency Medicine [facility] [email or web address]  Final Clinical Impressions(s) / ED Diagnoses     ICD-10-CM   1. Chest pain, unspecified type  R07.9       ED Discharge Orders     None        Discharge Instructions Discussed with and Provided to Patient:    Discharge Instructions      You were evaluated in the Emergency Department and after careful evaluation, we did not find any emergent condition requiring admission or further testing in the hospital.  Your exam/testing today was overall reassuring.  Please return to the Emergency Department if you experience any worsening of your condition.  Thank you for allowing [place] to be a part of your care.        Sabas Sous, MD 05/21/21 [phone]

## 2021-05-21 NOTE — Discharge Instructions (Addendum)
You were evaluated in the Emergency Department and after careful evaluation, we did not find any emergent condition requiring admission or further testing in the hospital.  Your exam/testing today was overall reassuring.  Please return to the Emergency Department if you experience any worsening of your condition.  Thank you for allowing us to be a part of your care.  

## 2021-05-23 ENCOUNTER — Inpatient Hospital Stay (HOSPITAL_COMMUNITY)
Admission: EM | Admit: 2021-05-23 | Discharge: 2021-05-29 | DRG: 897 | Disposition: A | Discharge status: Home / Self Care | Payer: Self-pay | Attending: Internal Medicine | Admitting: Internal Medicine

## 2021-05-23 ENCOUNTER — Other Ambulatory Visit: Payer: Self-pay

## 2021-05-23 ENCOUNTER — Encounter (HOSPITAL_COMMUNITY): Payer: Self-pay

## 2021-05-23 DIAGNOSIS — F10231 Alcohol dependence with withdrawal delirium: Secondary | ICD-10-CM | POA: Diagnosis present

## 2021-05-23 DIAGNOSIS — G4700 Insomnia, unspecified: Secondary | ICD-10-CM | POA: Diagnosis not present

## 2021-05-23 DIAGNOSIS — F32A Depression, unspecified: Secondary | ICD-10-CM | POA: Diagnosis present

## 2021-05-23 DIAGNOSIS — K219 Gastro-esophageal reflux disease without esophagitis: Secondary | ICD-10-CM | POA: Diagnosis present

## 2021-05-23 DIAGNOSIS — D7589 Other specified diseases of blood and blood-forming organs: Secondary | ICD-10-CM | POA: Diagnosis present

## 2021-05-23 DIAGNOSIS — D696 Thrombocytopenia, unspecified: Secondary | ICD-10-CM | POA: Diagnosis present

## 2021-05-23 DIAGNOSIS — E876 Hypokalemia: Secondary | ICD-10-CM | POA: Diagnosis not present

## 2021-05-23 DIAGNOSIS — F10930 Alcohol use, unspecified with withdrawal, uncomplicated: Secondary | ICD-10-CM

## 2021-05-23 DIAGNOSIS — R17 Unspecified jaundice: Secondary | ICD-10-CM | POA: Diagnosis present

## 2021-05-23 DIAGNOSIS — Z20822 Contact with and (suspected) exposure to covid-19: Secondary | ICD-10-CM | POA: Diagnosis present

## 2021-05-23 DIAGNOSIS — F17210 Nicotine dependence, cigarettes, uncomplicated: Secondary | ICD-10-CM | POA: Diagnosis present

## 2021-05-23 DIAGNOSIS — F10931 Alcohol use, unspecified with withdrawal delirium: Secondary | ICD-10-CM | POA: Diagnosis present

## 2021-05-23 DIAGNOSIS — F10239 Alcohol dependence with withdrawal, unspecified: Secondary | ICD-10-CM | POA: Diagnosis present

## 2021-05-23 DIAGNOSIS — F10131 Alcohol abuse with withdrawal delirium: Principal | ICD-10-CM | POA: Diagnosis present

## 2021-05-23 DIAGNOSIS — Z5900 Homelessness unspecified: Secondary | ICD-10-CM

## 2021-05-23 DIAGNOSIS — E1140 Type 2 diabetes mellitus with diabetic neuropathy, unspecified: Secondary | ICD-10-CM | POA: Diagnosis present

## 2021-05-23 DIAGNOSIS — I10 Essential (primary) hypertension: Secondary | ICD-10-CM | POA: Diagnosis present

## 2021-05-23 DIAGNOSIS — F10939 Alcohol use, unspecified with withdrawal, unspecified: Secondary | ICD-10-CM | POA: Diagnosis present

## 2021-05-23 DIAGNOSIS — Z79899 Other long term (current) drug therapy: Secondary | ICD-10-CM

## 2021-05-23 DIAGNOSIS — F1010 Alcohol abuse, uncomplicated: Secondary | ICD-10-CM | POA: Diagnosis present

## 2021-05-23 DIAGNOSIS — F10230 Alcohol dependence with withdrawal, uncomplicated: Secondary | ICD-10-CM

## 2021-05-23 DIAGNOSIS — Y900 Blood alcohol level of less than 20 mg/100 ml: Secondary | ICD-10-CM | POA: Diagnosis present

## 2021-05-23 DIAGNOSIS — F4310 Post-traumatic stress disorder, unspecified: Secondary | ICD-10-CM | POA: Diagnosis present

## 2021-05-23 LAB — CBC
HCT: 44.7 % (ref 39.0–52.0)
Hemoglobin: 15.4 g/dL (ref 13.0–17.0)
MCH: 35.2 pg — ABNORMAL HIGH (ref 26.0–34.0)
MCHC: 34.5 g/dL (ref 30.0–36.0)
MCV: 102.3 fL — ABNORMAL HIGH (ref 80.0–100.0)
Platelets: 155 10*3/uL (ref 150–400)
RBC: 4.37 MIL/uL (ref 4.22–5.81)
RDW: 13.1 % (ref 11.5–15.5)
WBC: 9.6 10*3/uL (ref 4.0–10.5)
nRBC: 0 % (ref 0.0–0.2)

## 2021-05-23 LAB — COMPREHENSIVE METABOLIC PANEL
ALT: 47 U/L — ABNORMAL HIGH (ref 0–44)
AST: 63 U/L — ABNORMAL HIGH (ref 15–41)
Albumin: 3.8 g/dL (ref 3.5–5.0)
Alkaline Phosphatase: 99 U/L (ref 38–126)
Anion gap: 15 (ref 5–15)
BUN: <5 mg/dL — ABNORMAL LOW (ref 6–20)
CO2: 26 mmol/L (ref 22–32)
Calcium: 9.6 mg/dL (ref 8.9–10.3)
Chloride: 95 mmol/L — ABNORMAL LOW (ref 98–111)
Creatinine, Ser: 0.59 mg/dL — ABNORMAL LOW (ref 0.61–1.24)
GFR, Estimated: >60 mL/min (ref >60)
Glucose, Bld: 77 mg/dL (ref 70–99)
Potassium: 3.9 mmol/L (ref 3.5–5.1)
Sodium: 136 mmol/L (ref 135–145)
Total Bilirubin: 1.3 mg/dL — ABNORMAL HIGH (ref 0.3–1.2)
Total Protein: 7.9 g/dL (ref 6.5–8.1)

## 2021-05-23 LAB — MAGNESIUM: Magnesium: 2.1 mg/dL (ref 1.7–2.4)

## 2021-05-23 LAB — ETHANOL: Alcohol, Ethyl (B): <10 mg/dL (ref <10)

## 2021-05-23 MED ORDER — FOLIC ACID 1 MG PO TABS
1.0000 mg | ORAL_TABLET | Freq: Every day | ORAL | Status: DC
  Administered 2021-05-24 – 2021-05-29 (×6): 1 mg via ORAL
  Filled 2021-05-23 (×6): qty 1

## 2021-05-23 MED ORDER — THIAMINE HCL 100 MG PO TABS
100.0000 mg | ORAL_TABLET | Freq: Every day | ORAL | Status: DC
  Administered 2021-05-24 – 2021-05-29 (×6): 100 mg via ORAL
  Filled 2021-05-23 (×6): qty 1

## 2021-05-23 MED ORDER — THIAMINE HCL 100 MG/ML IJ SOLN
Freq: Once | INTRAVENOUS | Status: AC
  Filled 2021-05-23: qty 1000

## 2021-05-23 MED ORDER — LORAZEPAM 2 MG/ML IJ SOLN
1.0000 mg | INTRAMUSCULAR | Status: AC | PRN
  Administered 2021-05-23 – 2021-05-24 (×2): 2 mg via INTRAVENOUS
  Administered 2021-05-24 (×2): 1 mg via INTRAVENOUS
  Administered 2021-05-24 – 2021-05-26 (×3): 2 mg via INTRAVENOUS
  Filled 2021-05-23 (×5): qty 1
  Filled 2021-05-23: qty 2
  Filled 2021-05-23: qty 1

## 2021-05-23 MED ORDER — ACETAMINOPHEN 650 MG RE SUPP: 650.0000 mg | Freq: Four times a day (QID) | RECTAL | Status: DC | PRN

## 2021-05-23 MED ORDER — THIAMINE HCL 100 MG/ML IJ SOLN: 100.0000 mg | Freq: Every day | INTRAMUSCULAR | Status: DC

## 2021-05-23 MED ORDER — ADULT MULTIVITAMIN W/MINERALS CH
1.0000 | ORAL_TABLET | Freq: Every day | ORAL | Status: DC
  Administered 2021-05-24 – 2021-05-29 (×6): 1 via ORAL
  Filled 2021-05-23 (×6): qty 1

## 2021-05-23 MED ORDER — LORAZEPAM 2 MG/ML IJ SOLN: 2.0000 mg | Freq: Once | INTRAMUSCULAR | Status: DC

## 2021-05-23 MED ORDER — SODIUM CHLORIDE 0.9 % IV BOLUS: 1000.0000 mL | Freq: Once | INTRAVENOUS | Status: DC

## 2021-05-23 MED ORDER — ACETAMINOPHEN 325 MG PO TABS
650.0000 mg | ORAL_TABLET | Freq: Four times a day (QID) | ORAL | Status: DC | PRN
  Administered 2021-05-26: 650 mg via ORAL
  Filled 2021-05-23: qty 2

## 2021-05-23 MED ORDER — LORAZEPAM 2 MG/ML IJ SOLN: 0.0000 mg | INTRAMUSCULAR | Status: DC

## 2021-05-23 MED ORDER — LORAZEPAM 2 MG/ML IJ SOLN: 0.0000 mg | Freq: Three times a day (TID) | INTRAMUSCULAR | Status: DC

## 2021-05-23 MED ORDER — ONDANSETRON HCL 4 MG PO TABS: 4.0000 mg | ORAL_TABLET | Freq: Four times a day (QID) | ORAL | Status: DC | PRN

## 2021-05-23 MED ORDER — ONDANSETRON HCL 4 MG/2ML IJ SOLN: 4.0000 mg | Freq: Four times a day (QID) | INTRAMUSCULAR | Status: DC | PRN

## 2021-05-23 MED ORDER — ENOXAPARIN SODIUM 40 MG/0.4ML IJ SOSY
40.0000 mg | PREFILLED_SYRINGE | INTRAMUSCULAR | Status: DC
  Administered 2021-05-24 – 2021-05-28 (×6): 40 mg via SUBCUTANEOUS
  Filled 2021-05-23 (×6): qty 0.4

## 2021-05-23 MED ORDER — LORAZEPAM 1 MG PO TABS
1.0000 mg | ORAL_TABLET | ORAL | Status: AC | PRN
  Administered 2021-05-25 – 2021-05-26 (×2): 3 mg via ORAL
  Filled 2021-05-23 (×2): qty 1
  Filled 2021-05-23: qty 3
  Filled 2021-05-23: qty 2

## 2021-05-23 MED ORDER — LORAZEPAM 2 MG/ML IJ SOLN
2.0000 mg | Freq: Once | INTRAMUSCULAR | Status: AC
  Administered 2021-05-23: 2 mg via INTRAVENOUS
  Filled 2021-05-23: qty 1

## 2021-05-23 NOTE — ED Provider Notes (Signed)
[facility]-EMERGENCY DEPT Provider Note   CSN: [id number] Arrival date & time: 05/23/21  1104     History Chief Complaint  Patient presents with   Alcohol Problem    Casmer Yepiz is a 45 y.o. male.   Alcohol Problem Pertinent negatives include no abdominal pain. Patient presents with alcohol withdrawal.  Drinks heavily.  Drinks at least 18 beers a day.  States last drink last night.  Now is feeling better.  Shaky.  History of alcohol withdrawal.  States he is gotten hallucinations in the past.  Also history of depression and PTSD.  Denies current suicidal thoughts however.  Denies current hallucinations.  Denies other substance abuse currently.     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 No pertinent surgical history.     History reviewed. No pertinent family history.  Social History   Tobacco Use   Smoking status: Every Day    Packs/day: 1.00    Types: Cigarettes   Smokeless tobacco: Never  Vaping Use   Vaping Use: Some days  Substance Use Topics   Alcohol use: Yes    Comment: heavily   Drug use: Not Currently    Types: Cocaine, Marijuana    Home Medications Prior to Admission medications   Medication Sig Start Date End Date Taking? Authorizing Provider  amLODipine (NORVASC) 10 MG tablet Take 1 tablet (10 mg total) by mouth daily. 05/02/21   Lauro Franklin, MD  gabapentin (NEURONTIN) 300 MG capsule Take  1 capsule (300 mg total) by mouth 3 (three) times daily. 05/02/21 06/01/21  Lauro Franklin, MD  mirtazapine (REMERON) 15 MG tablet Take 1 tablet (15 mg total) by mouth at bedtime. 05/02/21 06/01/21  Lauro Franklin, MD  nicotine polacrilex (NICORETTE) 2 MG gum Take 1 each (2 mg total) by mouth as needed for smoking cessation. Patient not taking: Reported on 05/16/2021 05/02/21   Lauro Franklin, MD  pantoprazole (PROTONIX) 40 MG tablet Take 1 tablet (40 mg total) by mouth daily. Patient not taking: Reported on 05/16/2021 05/02/21 06/01/21  Lauro Franklin, MD    Allergies    Patient has no known allergies.  Review of Systems   Review of Systems  Constitutional:  Positive for appetite change.  HENT:  Negative for congestion.   Respiratory:  Negative for choking.   Cardiovascular:  Negative for leg swelling.  Gastrointestinal:  Negative for abdominal pain.  Endocrine: Negative for polyuria.  Genitourinary:  Negative for enuresis.  Musculoskeletal:  Negative for arthralgias.  Neurological:  Positive for tremors.   Physical Exam Updated Vital Signs BP (!) 163/102   Pulse 73   Temp 98.8 F (37.1 C) (Oral)   Resp 20   Ht 5\' 9"  (1.753 m)   Wt 74.8 kg   SpO2 98%   BMI 24.37 kg/m   Physical Exam Vitals and nursing note reviewed.  HENT:     Head: Normocephalic.  Eyes:     Pupils: Pupils are  equal, round, and reactive to light.  Cardiovascular:     Rate and Rhythm: Regular rhythm.  Pulmonary:     Effort: Pulmonary effort is normal.  Abdominal:     Tenderness: There is no abdominal tenderness.  Musculoskeletal:        General: No tenderness.     Cervical back: Neck supple.  Neurological:     Mental Status: He is alert and oriented to person, place, and time.     Comments: Tremulous bilaterally but particularly on right hand.    ED Results / Procedures / Treatments   Labs (all labs ordered are listed, but only abnormal results are displayed) Labs Reviewed   COMPREHENSIVE METABOLIC PANEL  ETHANOL  RAPID URINE DRUG SCREEN, HOSP PERFORMED  CBC  MAGNESIUM    EKG None  Radiology No results found.  Procedures Procedures   Medications Ordered in ED Medications  sodium chloride 0.9 % bolus 1,000 mL (has no administration in time range)  LORazepam (ATIVAN) injection 2 mg (2 mg Intravenous Given 05/23/21 1521)    ED Course  I have reviewed the triage vital signs and the nursing notes.  Pertinent labs & imaging results that were available during my care of the patient were reviewed by me and considered in my medical decision making (see chart for details).    MDM Rules/Calculators/A&P                           Patient with tremors and alcohol withdrawal.  Has had some nausea.  Reportedly had some vomiting.  Drinks about 18 beers a day and has not drank today.  Lab work pending.  Still pending benzodiazepine treatment.  Care turned over to Dr. Silverio Lay. Final Clinical Impression(s) / ED Diagnoses Final diagnoses:  Alcohol withdrawal syndrome without complication [facility])    Rx / DC Orders ED Discharge Orders     None        Benjiman Core, MD 05/23/21 1539

## 2021-05-23 NOTE — H&P (Signed)
History and Physical    Daymien Houston HWT:[id number] DOB: 1975-11-11 DOA: 05/23/2021  PCP: Pcp, No  Patient coming from: Homeless  I have personally briefly reviewed patient's old medical records in [facility]  Chief Complaint: EtOH withdrawal  HPI: Francisco Houston is a 45 y.o. male with medical history significant of homelessness, EtOH abuse.  Last drink last night, no EtOH today.  Presents to ED with EtOH withdrawal symptoms, h/o same in past.  Symptoms constant, severe.  Symptoms include tremor.  No hallucinations currently.   ED Course: Given ativan 2mg  earlier.  Additional 2mg  ativan ordered but not given.  2L NS ordered and doesn't appear to have been given either.   Review of Systems: As per HPI, otherwise all review of systems negative.  Past Medical History:  Diagnosis Date   Alcohol intoxication (HCC)    Diabetes mellitus without complication (HCC)    Homelessness    PTSD (post-traumatic stress disorder)     History reviewed. No pertinent surgical history.   reports that he has been smoking cigarettes. He has been smoking an average of 1 pack per day. He has never used smokeless tobacco. He reports current alcohol use. He reports previous drug use. Drugs: Cocaine and Marijuana.  No Known Allergies  Family History  Problem Relation Age of Onset   Lung cancer Paternal Grandmother    Colon cancer Paternal Grandfather      Prior to Admission medications   Medication Sig Start Date End Date Taking? Authorizing Provider  amLODipine (NORVASC) 10 MG tablet Take 1 tablet (10 mg total) by mouth daily. 05/02/21   , MD  gabapentin (NEURONTIN) 300 MG capsule Take 1 capsule (300 mg total) by mouth 3 (three) times daily. 05/02/21 06/01/21  07/03/21, MD  mirtazapine (REMERON) 15 MG tablet Take 1 tablet (15 mg total) by mouth at bedtime. 05/02/21 06/01/21  07/03/21, MD  nicotine polacrilex (NICORETTE) 2 MG gum Take 1 each (2 mg  total) by mouth as needed for smoking cessation. 05/02/21   Lauro Franklin, MD  pantoprazole (PROTONIX) 40 MG tablet Take 1 tablet (40 mg total) by mouth daily. 05/02/21 06/01/21  07/03/21, MD    Physical Exam: Vitals:   05/23/21 1120 05/23/21 1143 05/23/21 1718 05/23/21 1725  BP:  (!) 163/102 (!) 142/89 (!) 142/89  Pulse:  73 68 68  Resp:    18  Temp:    98.7 F (37.1 C)  TempSrc:    Oral  SpO2:    100%  Weight: 74.8 kg     Height: 5\' 9"  (1.753 m)       Constitutional: Tremor, awake, alert Eyes: PERRL, lids and conjunctivae normal ENMT: Mucous membranes are moist. Posterior pharynx clear of any exudate or lesions.Normal dentition.  Neck: normal, supple, no masses, no thyromegaly Respiratory: clear to auscultation bilaterally, no wheezing, no crackles. Normal respiratory effort. No accessory muscle use.  Cardiovascular: Regular rate and rhythm, no murmurs / rubs / gallops. No extremity edema. 2+ pedal pulses. No carotid bruits.  Abdomen: no tenderness, no masses palpated. No hepatosplenomegaly. Bowel sounds positive.  Musculoskeletal: no clubbing / cyanosis. No joint deformity upper and lower extremities. Good ROM, no contractures. Normal muscle tone.  Skin: no rashes, lesions, ulcers. No induration Neurologic: CN 2-12 grossly intact. Sensation intact, DTR normal. Strength 5/5 in all 4. Tremor noted. Psychiatric: Normal judgment and insight. Alert and oriented x 3. Normal mood.    Labs on Admission: I  have personally reviewed following labs and imaging studies  CBC: Recent Labs  Lab 05/19/21 2106 05/20/21 2024 05/23/21 1516  WBC 8.3 7.4 9.6  NEUTROABS  --  3.9  --   HGB 14.8 15.1 15.4  HCT 42.8 42.4 44.7  MCV 100.2* 99.3 102.3*  PLT 154 173 155   Basic Metabolic Panel: Recent Labs  Lab 05/19/21 2106 05/20/21 2024 05/23/21 1516  NA 131* 135 136  K 2.9* 3.6 3.9  CL 100 102 95*  CO2 17* 21* 26  GLUCOSE 131* 98 77  BUN 5* <5* <5*  CREATININE 0.71  0.70 0.59*  CALCIUM 8.4* 8.6* 9.6  MG  --   --  2.1   GFR: Estimated Creatinine Clearance: 116.6 mL/min (A) (by C-G formula based on SCr of 0.59 mg/dL (L)). Liver Function Tests: Recent Labs  Lab 05/20/21 2024 05/23/21 1516  AST 53* 63*  ALT 46* 47*  ALKPHOS 104 99  BILITOT 0.3 1.3*  PROT 7.8 7.9  ALBUMIN 3.8 3.8   Recent Labs  Lab 05/20/21 2024  LIPASE 78*   No results for input(s): AMMONIA in the last 168 hours. Coagulation Profile: No results for input(s): INR, PROTIME in the last 168 hours. Cardiac Enzymes: No results for input(s): CKTOTAL, CKMB, CKMBINDEX, TROPONINI in the last 168 hours. BNP (last 3 results) No results for input(s): PROBNP in the last 8760 hours. HbA1C: No results for input(s): HGBA1C in the last 72 hours. CBG: No results for input(s): GLUCAP in the last 168 hours. Lipid Profile: No results for input(s): CHOL, HDL, LDLCALC, TRIG, CHOLHDL, LDLDIRECT in the last 72 hours. Thyroid Function Tests: No results for input(s): TSH, T4TOTAL, FREET4, T3FREE, THYROIDAB in the last 72 hours. Anemia Panel: No results for input(s): VITAMINB12, FOLATE, FERRITIN, TIBC, IRON, RETICCTPCT in the last 72 hours. Urine analysis:    Component Value Date/Time   COLORURINE STRAW (A) 03/13/2021 1730   APPEARANCEUR CLEAR 03/13/2021 1730   LABSPEC 1.002 (L) 03/13/2021 1730   PHURINE 6.0 03/13/2021 1730   GLUCOSEU NEGATIVE 03/13/2021 1730   HGBUR SMALL (A) 03/13/2021 1730   BILIRUBINUR NEGATIVE 03/13/2021 1730   KETONESUR NEGATIVE 03/13/2021 1730   PROTEINUR 30 (A) 03/13/2021 1730   NITRITE NEGATIVE 03/13/2021 1730   LEUKOCYTESUR NEGATIVE 03/13/2021 1730    Radiological Exams on Admission: No results found.  EKG: Independently reviewed.  Assessment/Plan Principal Problem:   DTs (delirium tremens) (HCC) Active Problems:   Alcohol abuse    DTs - EtOH withdrawal, CIWA pathway with scheduled ativan IVF: will switch order to 1L banana bag Tele monitor ? DM  - Chart diagnosis but takes no meds and BGLs look good. A1C only 5.6 earlier this month.  DVT prophylaxis: Lovenox Code Status: Full Family Communication: No family in room Disposition Plan: TBD, pending resolution of withdrawals Consults called: None Admission status: Place in 40    Jessup Ogas M. DO [facility]  How to contact the [facility] Attending or Consulting provider 7A - 7P or covering provider during after hours 7P -7A, for this patient?  Check the care team in [facility] and look for a) attending/consulting TRH provider listed and b) the [facility] team listed Log into [email or web address]  Amion Physician Scheduling and messaging for groups and whole hospitals  On call and physician scheduling software for group practices, residents, hospitalists and other medical providers for call, clinic, rotation and shift schedules. OnCall [employer] is a hospital-wide system for scheduling doctors and paging doctors on call. EasyPlot is for scientific plotting and  data analysis.  [email or web address]  and use 's universal password to access. If you do not have the password, please contact the hospital operator.  Locate the [facility] provider you are looking for under [facility] and page to a number that you can be directly reached. If you still have difficulty reaching the provider, please page the [facility] (Director on Call) for the Hospitalists listed on amion for assistance.  05/23/2021, 8:57 PM

## 2021-05-23 NOTE — ED Provider Notes (Signed)
  Physical Exam  BP (!) 142/89 (BP Location: Right Arm)   Pulse 68   Temp 98.7 F (37.1 C) (Oral)   Resp 18   Ht 5\' 9"  (1.753 m)   Wt 74.8 kg   SpO2 100%   BMI 24.37 kg/m   Physical Exam  ED Course/Procedures     Procedures  MDM  Patient care assumed at 3 PM.  Patient has a history of alcohol use and did not drink alcohol today.  He is here with hallucinations and tremor.  His initial CIWA score is 22.  Signout pending labs and reassessment  6:00 PM Patient is still tremulous.  He is also hypertensive.  His CIWA came down to 12 from 34.  Since he is still tremulous he will need admission for alcohol withdrawal    21, MD 05/23/21 1800

## 2021-05-23 NOTE — ED Triage Notes (Signed)
Pt to er via ems, per ems pt is here for etoh withdrawal, states that they placed a 20 gauge iv in L ac, and gave 4 of zofran and a bolus.  States that this helped with nausea, states that before he was vomiting some yellow liquid.  Pt states that he drinks about 18 beers a day and had his last drink last night, pt is shaky, states that he would like detox.

## 2021-05-24 DIAGNOSIS — F10939 Alcohol use, unspecified with withdrawal, unspecified: Secondary | ICD-10-CM | POA: Diagnosis present

## 2021-05-24 DIAGNOSIS — F10930 Alcohol use, unspecified with withdrawal, uncomplicated: Secondary | ICD-10-CM

## 2021-05-24 DIAGNOSIS — F1010 Alcohol abuse, uncomplicated: Secondary | ICD-10-CM

## 2021-05-24 DIAGNOSIS — F10230 Alcohol dependence with withdrawal, uncomplicated: Secondary | ICD-10-CM

## 2021-05-24 DIAGNOSIS — F10239 Alcohol dependence with withdrawal, unspecified: Secondary | ICD-10-CM | POA: Diagnosis present

## 2021-05-24 DIAGNOSIS — F10231 Alcohol dependence with withdrawal delirium: Secondary | ICD-10-CM

## 2021-05-24 LAB — RESP PANEL BY RT-PCR (FLU A&B, COVID) ARPGX2
Influenza A by PCR: NEGATIVE
Influenza B by PCR: NEGATIVE
SARS Coronavirus 2 by RT PCR: NEGATIVE

## 2021-05-24 LAB — COMPREHENSIVE METABOLIC PANEL
ALT: 36 U/L (ref 0–44)
AST: 46 U/L — ABNORMAL HIGH (ref 15–41)
Albumin: 3.2 g/dL — ABNORMAL LOW (ref 3.5–5.0)
Alkaline Phosphatase: 80 U/L (ref 38–126)
Anion gap: 10 (ref 5–15)
BUN: <5 mg/dL — ABNORMAL LOW (ref 6–20)
CO2: 28 mmol/L (ref 22–32)
Calcium: 9.1 mg/dL (ref 8.9–10.3)
Chloride: 98 mmol/L (ref 98–111)
Creatinine, Ser: 0.58 mg/dL — ABNORMAL LOW (ref 0.61–1.24)
GFR, Estimated: >60 mL/min (ref >60)
Glucose, Bld: 90 mg/dL (ref 70–99)
Potassium: 3.7 mmol/L (ref 3.5–5.1)
Sodium: 136 mmol/L (ref 135–145)
Total Bilirubin: 1.2 mg/dL (ref 0.3–1.2)
Total Protein: 6.5 g/dL (ref 6.5–8.1)

## 2021-05-24 LAB — GLUCOSE, CAPILLARY
Glucose-Capillary: 75 mg/dL (ref 70–99)
Glucose-Capillary: 93 mg/dL (ref 70–99)
Glucose-Capillary: 92 mg/dL (ref 70–99)
Glucose-Capillary: 100 mg/dL — ABNORMAL HIGH (ref 70–99)

## 2021-05-24 LAB — CBC
HCT: 42.3 % (ref 39.0–52.0)
Hemoglobin: 14 g/dL (ref 13.0–17.0)
MCH: 34.5 pg — ABNORMAL HIGH (ref 26.0–34.0)
MCHC: 33.1 g/dL (ref 30.0–36.0)
MCV: 104.2 fL — ABNORMAL HIGH (ref 80.0–100.0)
Platelets: 149 10*3/uL — ABNORMAL LOW (ref 150–400)
RBC: 4.06 MIL/uL — ABNORMAL LOW (ref 4.22–5.81)
RDW: 12.9 % (ref 11.5–15.5)
WBC: 6.3 10*3/uL (ref 4.0–10.5)
nRBC: 0 % (ref 0.0–0.2)

## 2021-05-24 LAB — PHOSPHORUS
Phosphorus: 2.4 mg/dL — ABNORMAL LOW (ref 2.5–4.6)
Phosphorus: 3.2 mg/dL (ref 2.5–4.6)

## 2021-05-24 LAB — MAGNESIUM: Magnesium: 2 mg/dL (ref 1.7–2.4)

## 2021-05-24 LAB — HIV ANTIBODY (ROUTINE TESTING W REFLEX): HIV Screen 4th Generation wRfx: NONREACTIVE

## 2021-05-24 MED ORDER — MELATONIN 3 MG PO TABS
3.0000 mg | ORAL_TABLET | Freq: Once | ORAL | Status: AC
  Administered 2021-05-24: 3 mg via ORAL
  Filled 2021-05-24: qty 1

## 2021-05-24 MED ORDER — INSULIN ASPART 100 UNIT/ML IJ SOLN
0.0000 [IU] | Freq: Three times a day (TID) | INTRAMUSCULAR | Status: DC
  Administered 2021-05-25 – 2021-05-26 (×2): 2 [IU] via SUBCUTANEOUS

## 2021-05-24 MED ORDER — ZINC OXIDE 40 % EX OINT
TOPICAL_OINTMENT | CUTANEOUS | Status: DC | PRN
  Filled 2021-05-24: qty 57

## 2021-05-24 MED ORDER — INSULIN ASPART 100 UNIT/ML IJ SOLN: 0.0000 [IU] | Freq: Every day | INTRAMUSCULAR | Status: DC

## 2021-05-24 NOTE — Plan of Care (Signed)
  Problem: Education: Goal: Knowledge of disease or condition will improve Outcome: Progressing   Problem: Education: Goal: Knowledge of General Education information will improve Description: Including pain rating scale, medication(s)/side effects and non-pharmacologic comfort measures Outcome: Progressing   Problem: Health Behavior/Discharge Planning: Goal: Ability to manage health-related needs will improve Outcome: Progressing   Problem: Activity: Goal: Risk for activity intolerance will decrease Outcome: Progressing   Problem: Nutrition: Goal: Adequate nutrition will be maintained Outcome: Progressing   Problem: Coping: Goal: Level of anxiety will decrease Outcome: Progressing

## 2021-05-24 NOTE — TOC Initial Note (Signed)
Transition of Care [facility]) - Initial/Assessment Note    Patient Details  Name: Francisco Houston MRN: [id number] Date of Birth: [birth year]  [facility]) CM/SW Contact:    Lanier Clam, RN Phone Number: 05/24/2021, 12:39 PM  Clinical Narrative: Spoke to patient about d/c plan-lives in a tent;would like to go to a shelter, transitional housing, & etoh resources Informed of [facility] @ d/c-day shelter(they provide NP,meds, asst w/shelter)to call shelters from list while in hospital,may need MATCH(depending on meds @ d/c);& bus pass.Continue to monitor.                  Expected Discharge Plan: Homeless Shelter Barriers to Discharge: Continued Medical Work up   Patient Goals and CMS Choice Patient states their goals for this hospitalization and ongoing recovery are:: return back to tent or [facility] CMS Medicare.gov Compare Post Acute Care list provided to:: Patient    Expected Discharge Plan and Services Expected Discharge Plan: Homeless Shelter   Discharge Planning Services: CM Consult Post Acute Care Choice:  (Tent/IRC) Living arrangements for the past 2 months: Homeless (Lives in a tent)                                      Prior Living Arrangements/Services Living arrangements for the past 2 months: Homeless (Lives in a tent) Lives with:: Self Patient language and need for interpreter reviewed:: Yes Do you feel safe going back to the place where you live?: Yes      Need for Family Participation in Patient Care: No (Comment) Care giver support system in place?: Yes (comment)   Criminal Activity/Legal Involvement Pertinent to Current Situation/Hospitalization: No - Comment as needed  Activities of Daily Living Home Assistive Devices/Equipment: None ADL Screening (condition at time of admission) Patient's cognitive ability adequate to safely complete daily activities?: Yes Is the patient deaf or have difficulty hearing?: Yes Does the patient  have difficulty seeing, even when wearing glasses/contacts?: Yes Does the patient have difficulty concentrating, remembering, or making decisions?: Yes Patient able to express need for assistance with ADLs?: Yes Does the patient have difficulty dressing or bathing?: Yes Independently performs ADLs?: Yes (appropriate for developmental age) Does the patient have difficulty walking or climbing stairs?: No Weakness of Legs: Both Weakness of Arms/Hands: Both  Permission Sought/Granted Permission sought to share information with : Case Manager Permission granted to share information with : Yes, Verbal Permission Granted  Share Information with NAME: Case manager           Emotional Assessment Appearance:: Appears stated age Attitude/Demeanor/Rapport: Gracious Affect (typically observed): Accepting Orientation: : Oriented to Self, Oriented to Place, Oriented to  Time, Oriented to Situation Alcohol / Substance Use: Alcohol Use Psych Involvement: No (comment)  Admission diagnosis:  DTs (delirium tremens) (HCC) [F10.231] Alcohol withdrawal syndrome without complication (HCC) [F10.230] Patient Active Problem List   Diagnosis Date Noted   DTs (delirium tremens) (HCC) 05/23/2021   Substance induced mood disorder (HCC) 03/13/2021   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

## 2021-05-24 NOTE — Progress Notes (Signed)
PROGRESS NOTE    Francisco Houston  IRW:[id number] DOB: 25-May-1976 DOA: 05/23/2021 PCP: Pcp, No   Brief Narrative:  The patient is a 45 year old Caucasian male with a past medical history significant for but not limited to alcohol abuse who drinks daily as well as homelessness and other comorbidities who presented to the ED with alcohol withdrawal symptoms.  He stated that his last drink was the day before he presented.  He felt like he was having similar withdrawal symptoms I considered it in the past with tremors and hallucinations.  He was given 2 mg of Ativan in the ED and additional 2 mg of Ativan and was placed on a CIWA protocol.  He was given 2 L normal saline boluses and admitted for alcohol withdrawal and delirium tremens  Assessment & Plan:   Principal Problem:   DTs (delirium tremens) (HCC) Active Problems:   Alcohol abuse  DTs in the Setting of Alcohol Abuse with Withdrawal  -Presented with EtOH withdrawal, CIWA pathway with scheduled Lorazepam -Patient drinks Daily and last drink was Day before admission -C/w MVI+Minerals, Thiamine and Folic Acid -Continue to Monitor CIWA per Protocol -Will nee Social Work assistance for Resources   Abnormal LFTs/Elevated LFTs -In the setting of EtOH Abuse -Improving. Patient's AST went from 63 -> 46 and ALT went from 47 -> 36 -Continue to Monitor and Trend -Repeat CMP in the AM   Hypophosphatemia -Mild. Phos Level was 2.4; Repeat Value was 3.3 -Continue to Monitor and Trend -Repeat CMP in the AM   Hyperbilirubinemia -Likely Reactive -Pateint's T Bili went from 0.3 -> 1.3 -> 1.2 -Continue to Monitor and Trend -Repeat CMP in the AM   Macrocytosis w/o  current Anemia -Patient's Hgb/Hct wnet from 15.1/4.2 -> 15.4/44.7 -> 14.0/42.3 and has an MCV of 104.2 -Check B12 and Folate Level in the AM  -Continue to Monitor for S/Sx of Bleeding; No overt Bleeding noted -Repeat CBC in the AM   Thromobocytopenia  -Patient's Platelet Count  went from 173 -> 155 -> 149 -Continue to Monitor for S/Sx of Bleeding; No overt bleeding noted -Repeat CBC in the AM   DVT prophylaxis: Enoxparin 40 mg sq q24h Code Status: FULL CODE  Family Communication: No family present at bedside  Disposition Plan: Pending Further Clinical Improvement in Withdrawal Sx  Status is: Observation  The patient will require care spanning > 2 midnights and should be moved to inpatient because: Unsafe d/c plan, IV treatments appropriate due to intensity of illness or inability to take PO, and Inpatient level of care appropriate due to severity of illness  Dispo: The patient is from: Home              Anticipated d/c is to: Home              Patient currently is not medically stable to d/c.   Difficult to place patient No  Consultants:  None  Procedures: None  Antimicrobials:  Anti-infectives (From admission, onward)    None        Subjective: Seen and examined at bedside and was still very jittery and states that he is hallucinating earlier.  No chest pain, shortness breath.  No lightheadedness or dizziness.  No other concerns or complaints at this time there are no family currently at bedside.  Objective: Vitals:   05/23/21 1725 05/23/21 2330 05/24/21 0158 05/24/21 0541  BP: (!) 142/89 132/85 (!) 135/95 118/82  Pulse: 68 61 78 61  Resp: 18 12 20  16  Temp: 98.7 F (37.1 C)  98.3 F (36.8 C) 98.5 F (36.9 C)  TempSrc: Oral  Oral Oral  SpO2: 100% 92% 97% 97%  Weight:      Height:        Intake/Output Summary (Last 24 hours) at 05/24/2021 [id number] Last data filed at 05/24/2021 0600 Gross per 24 hour  Intake 1000 ml  Output --  Net 1000 ml   Filed Weights   05/23/21 1120  Weight: 74.8 kg   Examination: Physical Exam:  Constitutional: WN/WD Caucasian male currently in NAD and appears anxious and a little jittery Eyes: Lids and conjunctivae normal, sclerae anicteric  ENMT: External Ears, Nose appear normal. Grossly normal hearing.   Neck: Appears normal, supple, no cervical masses, normal ROM, no appreciable thyromegaly; no JVD Respiratory: Diminished to auscultation bilaterally with coarse breath sound, no wheezing, rales, rhonchi or crackles. Normal respiratory effort and patient is not tachypenic. No accessory muscle use.  Unlabored breathing Cardiovascular: RRR, no murmurs / rubs / gallops. S1 and S2 auscultated.  Abdomen: Soft, non-tender, non-distended. Bowel sounds positive.  GU: Deferred. Musculoskeletal: No clubbing / cyanosis of digits/nails. No joint deformity upper and lower extremities.  Skin: No rashes, lesions, ulcers on a limited skin evaluation. No induration; Warm and dry.  Neurologic: CN 2-12 grossly intact with no focal deficits. Has some tremors. Romberg sign and cerebellar reflexes not assessed.  Psychiatric: Normal judgment and insight. Alert and oriented x 3. Normal mood and appropriate affect.   Data Reviewed: I have personally reviewed following labs and imaging studies  CBC: Recent Labs  Lab 05/19/21 2106 05/20/21 2024 05/23/21 1516 05/24/21 0455  WBC 8.3 7.4 9.6 6.3  NEUTROABS  --  3.9  --   --   HGB 14.8 15.1 15.4 14.0  HCT 42.8 42.4 44.7 42.3  MCV 100.2* 99.3 102.3* 104.2*  PLT 154 173 155 149*   Basic Metabolic Panel: Recent Labs  Lab 05/19/21 2106 05/20/21 2024 05/23/21 1516 05/23/21 2315 05/24/21 0455  NA 131* 135 136  --  136  K 2.9* 3.6 3.9  --  3.7  CL 100 102 95*  --  98  CO2 17* 21* 26  --  28  GLUCOSE 131* 98 77  --  90  BUN 5* <5* <5*  --  <5*  CREATININE 0.71 0.70 0.59*  --  0.58*  CALCIUM 8.4* 8.6* 9.6  --  9.1  MG  --   --  2.1  --   --   PHOS  --   --   --  2.4*  --    GFR: Estimated Creatinine Clearance: 116.6 mL/min (A) (by C-G formula based on SCr of 0.58 mg/dL (L)). Liver Function Tests: Recent Labs  Lab 05/20/21 2024 05/23/21 1516 05/24/21 0455  AST 53* 63* 46*  ALT 46* 47* 36  ALKPHOS 104 99 80  BILITOT 0.3 1.3* 1.2  PROT 7.8 7.9 6.5   ALBUMIN 3.8 3.8 3.2*   Recent Labs  Lab 05/20/21 2024  LIPASE 78*   No results for input(s): AMMONIA in the last 168 hours. Coagulation Profile: No results for input(s): INR, PROTIME in the last 168 hours. Cardiac Enzymes: No results for input(s): CKTOTAL, CKMB, CKMBINDEX, TROPONINI in the last 168 hours. BNP (last 3 results) No results for input(s): PROBNP in the last 8760 hours. HbA1C: No results for input(s): HGBA1C in the last 72 hours. CBG: Recent Labs  Lab 05/24/21 0738  GLUCAP 75   Lipid Profile: No results for  input(s): CHOL, HDL, LDLCALC, TRIG, CHOLHDL, LDLDIRECT in the last 72 hours. Thyroid Function Tests: No results for input(s): TSH, T4TOTAL, FREET4, T3FREE, THYROIDAB in the last 72 hours. Anemia Panel: No results for input(s): VITAMINB12, FOLATE, FERRITIN, TIBC, IRON, RETICCTPCT in the last 72 hours. Sepsis Labs: No results for input(s): PROCALCITON, LATICACIDVEN in the last 168 hours.  Recent Results (from the past 240 hour(s))  Resp Panel by RT-PCR (Flu A&B, Covid) Nasopharyngeal Swab     Status: None   Collection Time: 05/15/21  7:41 PM   Specimen: Nasopharyngeal Swab; Nasopharyngeal(NP) swabs in vial transport medium  Result Value Ref Range Status   SARS Coronavirus 2 by RT PCR NEGATIVE NEGATIVE Final    Comment: (NOTE) SARS-CoV-2 target nucleic acids are NOT DETECTED.  The SARS-CoV-2 RNA is generally detectable in upper respiratory specimens during the acute phase of infection. The lowest concentration of SARS-CoV-2 viral copies this assay can detect is 138 copies/mL. A negative result does not preclude SARS-Cov-2 infection and should not be used as the sole basis for treatment or other patient management decisions. A negative result may occur with  improper specimen collection/handling, submission of specimen other than nasopharyngeal swab, presence of viral mutation(s) within the areas targeted by this assay, and inadequate number of  viral copies(<138 copies/mL). A negative result must be combined with clinical observations, patient history, and epidemiological information. The expected result is Negative.  Fact Sheet for Patients:  BloggerCourse.com  Fact Sheet for Healthcare Providers:  SeriousBroker.it  This test is no t yet approved or cleared by the [place] FDA and  has been authorized for detection and/or diagnosis of SARS-CoV-2 by FDA under an Emergency Use Authorization (EUA). This EUA will remain  in effect (meaning this test can be used) for the duration of the COVID-19 declaration under Section 564(b)(1) of the Act, 21 U.S.C.section 360bbb-3(b)(1), unless the authorization is terminated  or revoked sooner.       Influenza A by PCR NEGATIVE NEGATIVE Final   Influenza B by PCR NEGATIVE NEGATIVE Final    Comment: (NOTE) The Xpert Xpress SARS-CoV-2/FLU/RSV plus assay is intended as an aid in the diagnosis of influenza from Nasopharyngeal swab specimens and should not be used as a sole basis for treatment. Nasal washings and aspirates are unacceptable for Xpert Xpress SARS-CoV-2/FLU/RSV testing.  Fact Sheet for Patients: BloggerCourse.com  Fact Sheet for Healthcare Providers: SeriousBroker.it  This test is not yet approved or cleared by the [place] FDA and has been authorized for detection and/or diagnosis of SARS-CoV-2 by FDA under an Emergency Use Authorization (EUA). This EUA will remain in effect (meaning this test can be used) for the duration of the COVID-19 declaration under Section 564(b)(1) of the Act, 21 U.S.C. section 360bbb-3(b)(1), unless the authorization is terminated or revoked.  Performed at [facility], [address] [place], [place] [address]   Resp Panel by RT-PCR (Flu A&B, Covid) Nasopharyngeal Swab     Status: None   Collection Time:  05/23/21 11:00 PM   Specimen: Nasopharyngeal Swab; Nasopharyngeal(NP) swabs in vial transport medium  Result Value Ref Range Status   SARS Coronavirus 2 by RT PCR NEGATIVE NEGATIVE Final    Comment: (NOTE) SARS-CoV-2 target nucleic acids are NOT DETECTED.  The SARS-CoV-2 RNA is generally detectable in upper respiratory specimens during the acute phase of infection. The lowest concentration of SARS-CoV-2 viral copies this assay can detect is 138 copies/mL. A negative result does not preclude SARS-Cov-2 infection and should not be  used as the sole basis for treatment or other patient management decisions. A negative result may occur with  improper specimen collection/handling, submission of specimen other than nasopharyngeal swab, presence of viral mutation(s) within the areas targeted by this assay, and inadequate number of viral copies(<138 copies/mL). A negative result must be combined with clinical observations, patient history, and epidemiological information. The expected result is Negative.  Fact Sheet for Patients:  BloggerCourse.com  Fact Sheet for Healthcare Providers:  SeriousBroker.it  This test is no t yet approved or cleared by the [place] FDA and  has been authorized for detection and/or diagnosis of SARS-CoV-2 by FDA under an Emergency Use Authorization (EUA). This EUA will remain  in effect (meaning this test can be used) for the duration of the COVID-19 declaration under Section 564(b)(1) of the Act, 21 U.S.C.section 360bbb-3(b)(1), unless the authorization is terminated  or revoked sooner.       Influenza A by PCR NEGATIVE NEGATIVE Final   Influenza B by PCR NEGATIVE NEGATIVE Final    Comment: (NOTE) The Xpert Xpress SARS-CoV-2/FLU/RSV plus assay is intended as an aid in the diagnosis of influenza from Nasopharyngeal swab specimens and should not be used as a sole basis for treatment. Nasal washings  and aspirates are unacceptable for Xpert Xpress SARS-CoV-2/FLU/RSV testing.  Fact Sheet for Patients: BloggerCourse.com  Fact Sheet for Healthcare Providers: SeriousBroker.it  This test is not yet approved or cleared by the [place] FDA and has been authorized for detection and/or diagnosis of SARS-CoV-2 by FDA under an Emergency Use Authorization (EUA). This EUA will remain in effect (meaning this test can be used) for the duration of the COVID-19 declaration under Section 564(b)(1) of the Act, 21 U.S.C. section 360bbb-3(b)(1), unless the authorization is terminated or revoked.  Performed at [facility], [address] [place], [place] [address]     RN Pressure Injury Documentation:     Estimated body mass index is 24.37 kg/m as calculated from the following:   Height as of this encounter: 5\' 9"  (1.753 m).   Weight as of this encounter: 74.8 kg.  Malnutrition Type:   Malnutrition Characteristics:   Nutrition Interventions:     Radiology Studies: No results found.  Scheduled Meds:  enoxaparin (LOVENOX) injection  40 mg Subcutaneous Q24H   folic acid  1 mg Oral Daily   insulin aspart  0-15 Units Subcutaneous TID WC   insulin aspart  0-5 Units Subcutaneous QHS   multivitamin with minerals  1 tablet Oral Daily   thiamine  100 mg Oral Daily   Or   thiamine  100 mg Intravenous Daily   Continuous Infusions:   LOS: 0 days   , DO [facility] PAGER is on AMION  If 7PM-7AM, please contact night-coverage [email or web address]

## 2021-05-24 NOTE — Progress Notes (Signed)
Nutrition Brief Note  Patient identified on the Malnutrition Screening Tool (MST) Report. Patient reported losing 20 lb in the past 1 month.  Wt Readings from Last 15 Encounters:  05/23/21 74.8 kg  05/19/21 79.8 kg  05/15/21 71.2 kg  03/10/21 77.1 kg  02/20/21 85.7 kg    Body mass index is 24.37 kg/m. Patient meets criteria for normal weight based on current BMI. Weight yesterday was 165 lb and weight on 03/10/21 was 170 lb. This indicates 5 lb weight loss (3% body weight) in the past 2 months; not significant. Skin WDL.  Current diet order is Heart Healthy. Labs and medications reviewed.   Patient is homeless. Admitting dx of delirium tremens. Hx of alcohol abuse. He is OBV status.  No nutrition interventions warranted at this time. If nutrition issues arise, please consult RD.      Trenton Gammon, MS, RD, LDN, CNSC Inpatient Clinical Dietitian RD pager # available in AMION  After hours/weekend pager # available in [facility]

## 2021-05-25 DIAGNOSIS — D696 Thrombocytopenia, unspecified: Secondary | ICD-10-CM

## 2021-05-25 DIAGNOSIS — F10232 Alcohol dependence with withdrawal with perceptual disturbance: Secondary | ICD-10-CM

## 2021-05-25 DIAGNOSIS — R945 Abnormal results of liver function studies: Secondary | ICD-10-CM

## 2021-05-25 DIAGNOSIS — D7589 Other specified diseases of blood and blood-forming organs: Secondary | ICD-10-CM

## 2021-05-25 LAB — CBC WITH DIFFERENTIAL/PLATELET
Abs Immature Granulocytes: 0.02 10*3/uL (ref 0.00–0.07)
Basophils Absolute: 0.1 10*3/uL (ref 0.0–0.1)
Basophils Relative: 1 %
Eosinophils Absolute: 0.1 10*3/uL (ref 0.0–0.5)
Eosinophils Relative: 2 %
HCT: 42 % (ref 39.0–52.0)
Hemoglobin: 14 g/dL (ref 13.0–17.0)
Immature Granulocytes: 0 %
Lymphocytes Relative: 16 %
Lymphs Abs: 0.9 10*3/uL (ref 0.7–4.0)
MCH: 34.3 pg — ABNORMAL HIGH (ref 26.0–34.0)
MCHC: 33.3 g/dL (ref 30.0–36.0)
MCV: 102.9 fL — ABNORMAL HIGH (ref 80.0–100.0)
Monocytes Absolute: 0.6 10*3/uL (ref 0.1–1.0)
Monocytes Relative: 10 %
Neutro Abs: 4 10*3/uL (ref 1.7–7.7)
Neutrophils Relative %: 71 %
Platelets: 155 10*3/uL (ref 150–400)
RBC: 4.08 MIL/uL — ABNORMAL LOW (ref 4.22–5.81)
RDW: 12.4 % (ref 11.5–15.5)
WBC: 5.7 10*3/uL (ref 4.0–10.5)
nRBC: 0 % (ref 0.0–0.2)

## 2021-05-25 LAB — GLUCOSE, CAPILLARY
Glucose-Capillary: 73 mg/dL (ref 70–99)
Glucose-Capillary: 150 mg/dL — ABNORMAL HIGH (ref 70–99)
Glucose-Capillary: 98 mg/dL (ref 70–99)
Glucose-Capillary: 137 mg/dL — ABNORMAL HIGH (ref 70–99)

## 2021-05-25 LAB — COMPREHENSIVE METABOLIC PANEL
ALT: 35 U/L (ref 0–44)
AST: 39 U/L (ref 15–41)
Albumin: 3.2 g/dL — ABNORMAL LOW (ref 3.5–5.0)
Alkaline Phosphatase: 80 U/L (ref 38–126)
Anion gap: 12 (ref 5–15)
BUN: 5 mg/dL — ABNORMAL LOW (ref 6–20)
CO2: 26 mmol/L (ref 22–32)
Calcium: 8.8 mg/dL — ABNORMAL LOW (ref 8.9–10.3)
Chloride: 98 mmol/L (ref 98–111)
Creatinine, Ser: 0.7 mg/dL (ref 0.61–1.24)
GFR, Estimated: >60 mL/min (ref >60)
Glucose, Bld: 163 mg/dL — ABNORMAL HIGH (ref 70–99)
Potassium: 3.4 mmol/L — ABNORMAL LOW (ref 3.5–5.1)
Sodium: 136 mmol/L (ref 135–145)
Total Bilirubin: 1 mg/dL (ref 0.3–1.2)
Total Protein: 6.7 g/dL (ref 6.5–8.1)

## 2021-05-25 LAB — MAGNESIUM: Magnesium: 1.9 mg/dL (ref 1.7–2.4)

## 2021-05-25 LAB — PHOSPHORUS: Phosphorus: 3.7 mg/dL (ref 2.5–4.6)

## 2021-05-25 MED ORDER — NICOTINE 21 MG/24HR TD PT24
21.0000 mg | MEDICATED_PATCH | Freq: Every day | TRANSDERMAL | Status: DC
  Administered 2021-05-25 – 2021-05-29 (×5): 21 mg via TRANSDERMAL
  Filled 2021-05-25 (×5): qty 1

## 2021-05-25 MED ORDER — POTASSIUM CHLORIDE CRYS ER 20 MEQ PO TBCR
40.0000 meq | EXTENDED_RELEASE_TABLET | Freq: Two times a day (BID) | ORAL | Status: AC
  Administered 2021-05-25 (×2): 40 meq via ORAL
  Filled 2021-05-25 (×2): qty 2

## 2021-05-25 NOTE — Progress Notes (Signed)
PROGRESS NOTE    Francisco RogersScott Houston  ZOX:[id number] DOB: 1976/10/08 DOA: 05/23/2021 PCP: Pcp, No   Brief Narrative:  The patient is a 45 year old Caucasian male with a past medical history significant for but not limited to alcohol abuse who drinks daily as well as homelessness and other comorbidities who presented to the ED with alcohol withdrawal symptoms.  He stated that his last drink was the day before he presented.  He felt like he was having similar withdrawal symptoms I considered it in the past with tremors and hallucinations.  He was given 2 mg of Ativan in the ED and additional 2 mg of Ativan and was placed on a CIWA protocol.  He was given 2 L normal saline boluses and admitted for alcohol withdrawal and delirium tremens. He is improving but still seeing "orbs" and having tremors but not as bad. Also will add Nicotine Patch for his Tobacco Abuse.   Assessment & Plan:   Principal Problem:   DTs (delirium tremens) (HCC) Active Problems:   Alcohol abuse   Alcohol withdrawal syndrome without complication (HCC)   Alcohol withdrawal (HCC)  DTs in the Setting of Alcohol Abuse with Withdrawal  -Presented with EtOH withdrawal, CIWA pathway with scheduled Lorazepam -Patient drinks Daily and last drink was Day before admission -C/w MVI+Minerals, Thiamine and Folic Acid -Continue to Monitor CIWA per Protocol -Will nee Social Work assistance for Resources and [facility]   Abnormal LFTs/Elevated LFTs, improved and resolved  -In the setting of EtOH Abuse -Improving. Patient's AST went from 63 -> 46 -> 39 and ALT went from 47 -> 36 -> 35 -Continue to Monitor and Trend -Repeat CMP in the AM   Hypophosphatemia -Mild. Phos Level is improved to 3.7 -Continue to Monitor and Trend -Repeat CMP in the AM   Tobacco Abuse -Smokes 1 PPD -Smoking Cessation Counseling given -Provided Nicotine Patch 21 mg TD  Hypokalemia -Patient's K+ was 3.4 -Replete with po Kcl 40 mEQ BID x2 -Continue  to Monitor and Replete as Necessary -Repeat CMP in the AM   Hyperbilirubinemia, improving  -Likely Reactive -Pateint's T Bili went from 0.3 -> 1.3 -> 1.2 -> 1.0 -Continue to Monitor and Trend -Repeat CMP in the AM   Macrocytosis w/o  current Anemia -Patient's Hgb/Hct wnet from 15.1/4.2 -> 15.4/44.7 -> 14.0/42.3 -> 14.0/42.0 and has an MCV of 102.9 -Check B12 and Folate Level in the AM  -Continue to Monitor for S/Sx of Bleeding; No overt Bleeding noted -Repeat CBC in the AM   Thromobocytopenia  -Patient's Platelet Count went from 173 -> 155 -> 149 -> 155 -Continue to Monitor for S/Sx of Bleeding; No overt bleeding noted -Repeat CBC in the AM   DVT prophylaxis: Enoxparin 40 mg sq q24h Code Status: FULL CODE  Family Communication: No family present at bedside  Disposition Plan: Pending Further Clinical Improvement in Withdrawal Sx  Status is: Inpatient   The patient will require care spanning > 2 midnights and should be moved to inpatient because: Unsafe d/c plan, IV treatments appropriate due to intensity of illness or inability to take PO, and Inpatient level of care appropriate due to severity of illness  Dispo: The patient is from: Home              Anticipated d/c is to: Home              Patient currently is not medically stable to d/c.   Difficult to place patient No  Consultants:  None  Procedures: None  Antimicrobials:  Anti-infectives (From admission, onward)    None        Subjective: Seen and examined at bedside he thinks he is improving but still seeing orb/in front of his eyes.  Also complained about some numbness in his toes and states that this happens every time he withdraws.  His tremors are improving but still not completely gone.  No other concerns or complaints at this time.  Objective: Vitals:   05/24/21 2126 05/25/21 0233 05/25/21 0609 05/25/21 1013  BP: (!) 143/97 (!) 132/91 (!) 142/97 (!) 133/94  Pulse: 73 (!) 55 (!) 57 85  Resp: Temp: 98.2 F (36.8 C) 98.2 F (36.8 C) 97.7 F (36.5 C) 98.4 F (36.9 C)  TempSrc:      SpO2: 98% 96% 98% 99%  Weight:      Height:        Intake/Output Summary (Last 24 hours) at 05/25/2021 1207 Last data filed at 05/25/2021 0741 Gross per 24 hour  Intake 600 ml  Output --  Net 600 ml    Filed Weights   05/23/21 1120  Weight: 74.8 kg   Examination: Physical Exam:  Constitutional: WN/WD Caucasian male in NAD and appears calm and comfortable Eyes: Lids and conjunctivae normal, sclerae anicteric  ENMT: External Ears, Nose appear normal. Grossly normal hearing.  Neck: Appears normal, supple, no cervical masses, normal ROM, no appreciable thyromegaly; no JVD Respiratory: Diminished to auscultation bilaterally, no wheezing, rales, rhonchi or crackles. Normal respiratory effort and patient is not tachypenic. No accessory muscle use. Unlabored breathing  Cardiovascular: RRR, no murmurs / rubs / gallops. S1 and S2 auscultated. No extremity edema. Pedal Pulses 2+ Abdomen: Soft, non-tender, non-distended. Bowel sounds positive.  GU: Deferred. Musculoskeletal: No clubbing / cyanosis of digits/nails. No joint deformity upper and lower extremities.  Skin: No rashes, lesions, ulcers on a limited skin evaluation. No induration; Warm and dry.  Neurologic: CN 2-12 grossly intact with no focal deficits. Mild Tremors. Romberg sign cerebellar reflexes not assessed.  Psychiatric: Normal judgment and insight. Alert and oriented x 3. Normal mood and appropriate affect.   Data Reviewed: I have personally reviewed following labs and imaging studies  CBC: Recent Labs  Lab 05/19/21 2106 05/20/21 2024 05/23/21 1516 05/24/21 0455 05/25/21 0838  WBC 8.3 7.4 9.6 6.3 5.7  NEUTROABS  --  3.9  --   --  4.0  HGB 14.8 15.1 15.4 14.0 14.0  HCT 42.8 42.4 44.7 42.3 42.0  MCV 100.2* 99.3 102.3* 104.2* 102.9*  PLT 154 173 155 149* 155    Basic Metabolic Panel: Recent Labs  Lab 05/19/21 2106  05/20/21 2024 05/23/21 1516 05/23/21 2315 05/24/21 0452 05/24/21 0455 05/25/21 0838  NA 131* 135 136  --   --  136 136  K 2.9* 3.6 3.9  --   --  3.7 3.4*  CL 100 102 95*  --   --  98 98  CO2 17* 21* 26  --   --  28 26  GLUCOSE 131* 98 77  --   --  90 163*  BUN 5* <5* <5*  --   --  <5* 5*  CREATININE 0.71 0.70 0.59*  --   --  0.58* 0.70  CALCIUM 8.4* 8.6* 9.6  --   --  9.1 8.8*  MG  --   --  2.1  --  2.0  --  1.9  PHOS  --   --   --  2.4* 3.2  --  3.7    GFR: Estimated Creatinine Clearance: 116.6 mL/min (by C-G formula based on SCr of 0.7 mg/dL). Liver Function Tests: Recent Labs  Lab 05/20/21 2024 05/23/21 1516 05/24/21 0455 05/25/21 0838  AST 53* 63* 46* 39  ALT 46* 47* 36 35  ALKPHOS 104 99 80 80  BILITOT 0.3 1.3* 1.2 1.0  PROT 7.8 7.9 6.5 6.7  ALBUMIN 3.8 3.8 3.2* 3.2*    Recent Labs  Lab 05/20/21 2024  LIPASE 78*    No results for input(s): AMMONIA in the last 168 hours. Coagulation Profile: No results for input(s): INR, PROTIME in the last 168 hours. Cardiac Enzymes: No results for input(s): CKTOTAL, CKMB, CKMBINDEX, TROPONINI in the last 168 hours. BNP (last 3 results) No results for input(s): PROBNP in the last 8760 hours. HbA1C: No results for input(s): HGBA1C in the last 72 hours. CBG: Recent Labs  Lab 05/24/21 1105 05/24/21 1714 05/24/21 2130 05/25/21 0719 05/25/21 1118  GLUCAP 93 92 100* 73 150*    Lipid Profile: No results for input(s): CHOL, HDL, LDLCALC, TRIG, CHOLHDL, LDLDIRECT in the last 72 hours. Thyroid Function Tests: No results for input(s): TSH, T4TOTAL, FREET4, T3FREE, THYROIDAB in the last 72 hours. Anemia Panel: No results for input(s): VITAMINB12, FOLATE, FERRITIN, TIBC, IRON, RETICCTPCT in the last 72 hours. Sepsis Labs: No results for input(s): PROCALCITON, LATICACIDVEN in the last 168 hours.  Recent Results (from the past 240 hour(s))  Resp Panel by RT-PCR (Flu A&B, Covid) Nasopharyngeal Swab     Status: None    Collection Time: 05/15/21  7:41 PM   Specimen: Nasopharyngeal Swab; Nasopharyngeal(NP) swabs in vial transport medium  Result Value Ref Range Status   SARS Coronavirus 2 by RT PCR NEGATIVE NEGATIVE Final    Comment: (NOTE) SARS-CoV-2 target nucleic acids are NOT DETECTED.  The SARS-CoV-2 RNA is generally detectable in upper respiratory specimens during the acute phase of infection. The lowest concentration of SARS-CoV-2 viral copies this assay can detect is 138 copies/mL. A negative result does not preclude SARS-Cov-2 infection and should not be used as the sole basis for treatment or other patient management decisions. A negative result may occur with  improper specimen collection/handling, submission of specimen other than nasopharyngeal swab, presence of viral mutation(s) within the areas targeted by this assay, and inadequate number of viral copies(<138 copies/mL). A negative result must be combined with clinical observations, patient history, and epidemiological information. The expected result is Negative.  Fact Sheet for Patients:  BloggerCourse.com  Fact Sheet for Healthcare Providers:  SeriousBroker.it  This test is no t yet approved or cleared by the [place] FDA and  has been authorized for detection and/or diagnosis of SARS-CoV-2 by FDA under an Emergency Use Authorization (EUA). This EUA will remain  in effect (meaning this test can be used) for the duration of the COVID-19 declaration under Section 564(b)(1) of the Act, 21 U.S.C.section 360bbb-3(b)(1), unless the authorization is terminated  or revoked sooner.       Influenza A by PCR NEGATIVE NEGATIVE Final   Influenza B by PCR NEGATIVE NEGATIVE Final    Comment: (NOTE) The Xpert Xpress SARS-CoV-2/FLU/RSV plus assay is intended as an aid in the diagnosis of influenza from Nasopharyngeal swab specimens and should not be used as a sole basis for treatment.  Nasal washings and aspirates are unacceptable for Xpert Xpress SARS-CoV-2/FLU/RSV testing.  Fact Sheet for Patients: BloggerCourse.com  Fact Sheet for Healthcare Providers: SeriousBroker.it  This test is not yet  approved or cleared by the [place] and has been authorized for detection and/or diagnosis of SARS-CoV-2 by FDA under an Emergency Use Authorization (EUA). This EUA will remain in effect (meaning this test can be used) for the duration of the COVID-19 declaration under Section 564(b)(1) of the Act, 21 U.S.C. section 360bbb-3(b)(1), unless the authorization is terminated or revoked.  Performed at [facility], [address], [place], [place] [address]   Resp Panel by RT-PCR (Flu A&B, Covid) Nasopharyngeal Swab     Status: None   Collection Time: 05/23/21 11:00 PM   Specimen: Nasopharyngeal Swab; Nasopharyngeal(NP) swabs in vial transport medium  Result Value Ref Range Status   SARS Coronavirus 2 by RT PCR NEGATIVE NEGATIVE Final    Comment: (NOTE) SARS-CoV-2 target nucleic acids are NOT DETECTED.  The SARS-CoV-2 RNA is generally detectable in upper respiratory specimens during the acute phase of infection. The lowest concentration of SARS-CoV-2 viral copies this assay can detect is 138 copies/mL. A negative result does not preclude SARS-Cov-2 infection and should not be used as the sole basis for treatment or other patient management decisions. A negative result may occur with  improper specimen collection/handling, submission of specimen other than nasopharyngeal swab, presence of viral mutation(s) within the areas targeted by this assay, and inadequate number of viral copies(<138 copies/mL). A negative result must be combined with clinical observations, patient history, and epidemiological information. The expected result is Negative.  Fact Sheet for Patients:   BloggerCourse.com  Fact Sheet for Healthcare Providers:  SeriousBroker.it  This test is no t yet approved or cleared by the [place] FDA and  has been authorized for detection and/or diagnosis of SARS-CoV-2 by FDA under an Emergency Use Authorization (EUA). This EUA will remain  in effect (meaning this test can be used) for the duration of the COVID-19 declaration under Section 564(b)(1) of the Act, 21 U.S.C.section 360bbb-3(b)(1), unless the authorization is terminated  or revoked sooner.       Influenza A by PCR NEGATIVE NEGATIVE Final   Influenza B by PCR NEGATIVE NEGATIVE Final    Comment: (NOTE) The Xpert Xpress SARS-CoV-2/FLU/RSV plus assay is intended as an aid in the diagnosis of influenza from Nasopharyngeal swab specimens and should not be used as a sole basis for treatment. Nasal washings and aspirates are unacceptable for Xpert Xpress SARS-CoV-2/FLU/RSV testing.  Fact Sheet for Patients: BloggerCourse.com  Fact Sheet for Healthcare Providers: SeriousBroker.it  This test is not yet approved or cleared by the [place] FDA and has been authorized for detection and/or diagnosis of SARS-CoV-2 by FDA under an Emergency Use Authorization (EUA). This EUA will remain in effect (meaning this test can be used) for the duration of the COVID-19 declaration under Section 564(b)(1) of the Act, 21 U.S.C. section 360bbb-3(b)(1), unless the authorization is terminated or revoked.  Performed at [facility], [address] [place], [place] [address]      RN Pressure Injury Documentation:     Estimated body mass index is 24.37 kg/m as calculated from the following:   Height as of this encounter: 5\' 9"  (1.753 m).   Weight as of this encounter: 74.8 kg.  Malnutrition Type:   Malnutrition Characteristics:   Nutrition Interventions:      Radiology Studies: No results found.  Scheduled Meds:  enoxaparin (LOVENOX) injection  40 mg Subcutaneous Q24H   folic acid  1 mg Oral Daily   insulin aspart  0-15 Units Subcutaneous TID WC  insulin aspart  0-5 Units Subcutaneous QHS   multivitamin with minerals  1 tablet Oral Daily   nicotine  21 mg Transdermal Daily   thiamine  100 mg Oral Daily   Or   thiamine  100 mg Intravenous Daily   Continuous Infusions:   LOS: 1 day   Merlene Laughter, DO [facility] PAGER is on AMION  If 7PM-7AM, please contact night-coverage [email or web address]

## 2021-05-25 NOTE — Plan of Care (Signed)
  Problem: Education: Goal: Knowledge of disease or condition will improve Outcome: Progressing Goal: Understanding of discharge needs will improve Outcome: Progressing   Problem: Health Behavior/Discharge Planning: Goal: Ability to identify changes in lifestyle to reduce recurrence of condition will improve Outcome: Progressing   Problem: Safety: Goal: Ability to remain free from injury will improve Outcome: Progressing   Problem: Education: Goal: Knowledge of General Education information will improve Description: Including pain rating scale, medication(s)/side effects and non-pharmacologic comfort measures Outcome: Progressing   Problem: Health Behavior/Discharge Planning: Goal: Ability to manage health-related needs will improve Outcome: Progressing   Problem: Clinical Measurements: Goal: Ability to maintain clinical measurements within normal limits will improve Outcome: Progressing Goal: Will remain free from infection Outcome: Progressing Goal: Diagnostic test results will improve Outcome: Progressing Goal: Respiratory complications will improve Outcome: Progressing   Problem: Activity: Goal: Risk for activity intolerance will decrease Outcome: Progressing   Problem: Nutrition: Goal: Adequate nutrition will be maintained Outcome: Progressing   Problem: Coping: Goal: Level of anxiety will decrease Outcome: Progressing   Problem: Elimination: Goal: Will not experience complications related to bowel motility Outcome: Progressing Goal: Will not experience complications related to urinary retention Outcome: Progressing   Problem: Pain Managment: Goal: General experience of comfort will improve Outcome: Progressing   Problem: Safety: Goal: Ability to remain free from injury will improve Outcome: Progressing

## 2021-05-25 NOTE — Plan of Care (Signed)
  Problem: Health Behavior/Discharge Planning: Goal: Ability to identify changes in lifestyle to reduce recurrence of condition will improve Outcome: Progressing   Problem: Physical Regulation: Goal: Complications related to the disease process, condition or treatment will be avoided or minimized Outcome: Progressing   Problem: Health Behavior/Discharge Planning: Goal: Ability to manage health-related needs will improve Outcome: Progressing   Problem: Coping: Goal: Level of anxiety will decrease Outcome: Progressing   Problem: Activity: Goal: Risk for activity intolerance will decrease Outcome: Adequate for Discharge   Problem: Nutrition: Goal: Adequate nutrition will be maintained Outcome: Adequate for Discharge

## 2021-05-26 LAB — COMPREHENSIVE METABOLIC PANEL
ALT: 34 U/L (ref 0–44)
AST: 31 U/L (ref 15–41)
Albumin: 3.4 g/dL — ABNORMAL LOW (ref 3.5–5.0)
Alkaline Phosphatase: 78 U/L (ref 38–126)
Anion gap: 9 (ref 5–15)
BUN: 7 mg/dL (ref 6–20)
CO2: 28 mmol/L (ref 22–32)
Calcium: 9.3 mg/dL (ref 8.9–10.3)
Chloride: 102 mmol/L (ref 98–111)
Creatinine, Ser: 0.86 mg/dL (ref 0.61–1.24)
GFR, Estimated: >60 mL/min (ref >60)
Glucose, Bld: 104 mg/dL — ABNORMAL HIGH (ref 70–99)
Potassium: 3.9 mmol/L (ref 3.5–5.1)
Sodium: 139 mmol/L (ref 135–145)
Total Bilirubin: 0.5 mg/dL (ref 0.3–1.2)
Total Protein: 7.3 g/dL (ref 6.5–8.1)

## 2021-05-26 LAB — CBC WITH DIFFERENTIAL/PLATELET
Abs Immature Granulocytes: 0.03 10*3/uL (ref 0.00–0.07)
Basophils Absolute: 0.1 10*3/uL (ref 0.0–0.1)
Basophils Relative: 1 %
Eosinophils Absolute: 0.1 10*3/uL (ref 0.0–0.5)
Eosinophils Relative: 2 %
HCT: 43.2 % (ref 39.0–52.0)
Hemoglobin: 14.4 g/dL (ref 13.0–17.0)
Immature Granulocytes: 1 %
Lymphocytes Relative: 21 %
Lymphs Abs: 1.3 10*3/uL (ref 0.7–4.0)
MCH: 34.6 pg — ABNORMAL HIGH (ref 26.0–34.0)
MCHC: 33.3 g/dL (ref 30.0–36.0)
MCV: 103.8 fL — ABNORMAL HIGH (ref 80.0–100.0)
Monocytes Absolute: 0.7 10*3/uL (ref 0.1–1.0)
Monocytes Relative: 11 %
Neutro Abs: 4.2 10*3/uL (ref 1.7–7.7)
Neutrophils Relative %: 64 %
Platelets: 163 10*3/uL (ref 150–400)
RBC: 4.16 MIL/uL — ABNORMAL LOW (ref 4.22–5.81)
RDW: 12.3 % (ref 11.5–15.5)
WBC: 6.4 10*3/uL (ref 4.0–10.5)
nRBC: 0 % (ref 0.0–0.2)

## 2021-05-26 LAB — MAGNESIUM: Magnesium: 1.9 mg/dL (ref 1.7–2.4)

## 2021-05-26 LAB — GLUCOSE, CAPILLARY
Glucose-Capillary: 105 mg/dL — ABNORMAL HIGH (ref 70–99)
Glucose-Capillary: 112 mg/dL — ABNORMAL HIGH (ref 70–99)
Glucose-Capillary: 129 mg/dL — ABNORMAL HIGH (ref 70–99)
Glucose-Capillary: 158 mg/dL — ABNORMAL HIGH (ref 70–99)

## 2021-05-26 LAB — PHOSPHORUS: Phosphorus: 4.1 mg/dL (ref 2.5–4.6)

## 2021-05-26 MED ORDER — TRAZODONE HCL 50 MG PO TABS
50.0000 mg | ORAL_TABLET | Freq: Once | ORAL | Status: AC
  Administered 2021-05-26: 50 mg via ORAL
  Filled 2021-05-26: qty 1

## 2021-05-26 NOTE — Progress Notes (Signed)
PROGRESS NOTE    Francisco Houston  DXI:[id number] DOB: 12/12/1975 DOA: 05/23/2021 PCP: Pcp, No   Brief Narrative:  The patient is a 45 year old Caucasian male with a past medical history significant for but not limited to alcohol abuse who drinks daily as well as homelessness and other comorbidities who presented to the ED with alcohol withdrawal symptoms.  He stated that his last drink was the day before he presented.  He felt like he was having similar withdrawal symptoms I considered it in the past with tremors and hallucinations.  He was given 2 mg of Ativan in the ED and additional 2 mg of Ativan and was placed on a CIWA protocol.  He was given 2 L normal saline boluses and admitted for alcohol withdrawal and delirium tremens. He is improving but still seeing "orbs" and having tremors but not as bad. Also will add Nicotine Patch for his Tobacco Abuse. Still withdrawing and his CIWA was 12 this AM   Assessment & Plan:   Principal Problem:   DTs (delirium tremens) (HCC) Active Problems:   Alcohol abuse   Alcohol withdrawal syndrome without complication (HCC)   Alcohol withdrawal (HCC)  DTs in the Setting of Alcohol Abuse with Withdrawal  -Presented with EtOH withdrawal, CIWA pathway with scheduled Lorazepam -Patient drinks Daily and last drink was Day before admission -C/w MVI+Minerals, Thiamine and Folic Acid -Continue to Monitor CIWA per Protocol -Will nee Social Work assistance for Resources and will D/C to [facility]   Abnormal LFTs/Elevated LFTs, improved and resolved  -In the setting of EtOH Abuse -Improving. Patient's AST went from 63 -> 46 -> 39  -> 31 and ALT went from 47 -> 36 -> 35 -> 34 -Continue to Monitor and Trend -Repeat CMP in the AM   Hypophosphatemia -Mild. Phos Level is improved to 4.1 -Continue to Monitor and Trend -Repeat CMP in the AM   Tobacco Abuse -Smokes 1 PPD -Smoking Cessation Counseling given -Provided Nicotine Patch 21 mg TD  Hypokalemia -Patient's  K+ was 3.4 and improved to 3.9 -Continue to Monitor and Replete as Necessary -Repeat CMP in the AM   Hyperbilirubinemia, improving  -Likely Reactive -Pateint's T Bili went from 0.3 -> 1.3 -> 1.2 -> 1.0 -> 0.5 -Continue to Monitor and Trend -Repeat CMP in the AM   Macrocytosis w/o  current Anemia -Patient's Hgb/Hct wnet from 15.1/4.2 -> 15.4/44.7 -> 14.0/42.3 -> 14.0/42.0 -> 7/0.86and has an MCV of 103.8 -Check B12 and Folate Level in the AM  -Continue to Monitor for S/Sx of Bleeding; No overt Bleeding noted -Repeat CBC in the AM   Thromobocytopenia  -Patient's Platelet Count went from 173 -> 155 -> 149 -> 155 -> 163 -Continue to Monitor for S/Sx of Bleeding; No overt bleeding noted -Repeat CBC in the AM   DVT prophylaxis: Enoxparin 40 mg sq q24h Code Status: FULL CODE  Family Communication: No family present at bedside  Disposition Plan: Pending Further Clinical Improvement in Withdrawal Sx  Status is: Inpatient   The patient will require care spanning > 2 midnights and should be moved to inpatient because: Unsafe d/c plan, IV treatments appropriate due to intensity of illness or inability to take PO, and Inpatient level of care appropriate due to severity of illness  Dispo: The patient is from: Home              Anticipated d/c is to: Home              Patient currently is not  medically stable to d/c.   Difficult to place patient No  Consultants:  None  Procedures: None  Antimicrobials:  Anti-infectives (From admission, onward)    None        Subjective: Seen and examined at bedside he still withdrawing and his CIWA score was 12 this morning.  He is very agitated and anxious this morning and vomited.  No longer seeing the words.  No chest pain or shortness of breath.  No other concerns or complaints at this time.  Objective: Vitals:   05/25/21 1355 05/25/21 1926 05/26/21 0000 05/26/21 0616  BP: 117/88 (!) 133/91  137/89  Pulse: 72 67 88 (!) 57  Resp: 16 18  16    Temp: 98.5 F (36.9 C) 98.6 F (37 C)  (!) 97.5 F (36.4 C)  TempSrc: Oral Oral  Oral  SpO2: 98% 99%  99%  Weight:      Height:        Intake/Output Summary (Last 24 hours) at 05/26/2021 1304 Last data filed at 05/26/2021 0830 Gross per 24 hour  Intake 800 ml  Output --  Net 800 ml    Filed Weights   05/23/21 1120  Weight: 74.8 kg   Examination: Physical Exam:  Constitutional: WN/WD Caucasian male in NAD and appears anxious and a little uncomfortable Eyes: Lids and conjunctivae normal, sclerae anicteric  ENMT: External Ears, Nose appear normal. Grossly normal hearing.  Neck: Appears normal, supple, no cervical masses, normal ROM, no appreciable thyromegaly; no JVD Respiratory: Diminished to auscultation bilaterally, no wheezing, rales, rhonchi or crackles. Normal respiratory effort and patient is not tachypenic. No accessory muscle use. Unlabored breathing  Cardiovascular: RRR, no murmurs / rubs / gallops. S1 and S2 auscultated. No extremity edema.  Abdomen: Soft, non-tender, non-distended. Bowel sounds positive.  GU: Deferred. Musculoskeletal: No clubbing / cyanosis of digits/nails. No joint deformity upper and lower extremities.  Skin: No rashes, lesions, ulcers on a limited skin evaluation. No induration; Warm and dry.  Neurologic: CN 2-12 grossly intact with no focal deficits. Has some tremors  Psychiatric: Normal judgment and insight. Alert and oriented x 3. Anxious mood and appropriate affect.   Data Reviewed: I have personally reviewed following labs and imaging studies  CBC: Recent Labs  Lab 05/20/21 2024 05/23/21 1516 05/24/21 0455 05/25/21 0838 05/26/21 0532  WBC 7.4 9.6 6.3 5.7 6.4  NEUTROABS 3.9  --   --  4.0 4.2  HGB 15.1 15.4 14.0 14.0 14.4  HCT 42.4 44.7 42.3 42.0 43.2  MCV 99.3 102.3* 104.2* 102.9* 103.8*  PLT 173 155 149* 155 163    Basic Metabolic Panel: Recent Labs  Lab 05/20/21 2024 05/23/21 1516 05/23/21 2315 05/24/21 0452  05/24/21 0455 05/25/21 0838 05/26/21 0532  NA 135 136  --   --  136 136 139  K 3.6 3.9  --   --  3.7 3.4* 3.9  CL 102 95*  --   --  98 98 102  CO2 21* 26  --   --  28 26 28   GLUCOSE 98 77  --   --  90 163* 104*  BUN <5* <5*  --   --  <5* 5* 7  CREATININE 0.70 0.59*  --   --  0.58* 0.70 0.86  CALCIUM 8.6* 9.6  --   --  9.1 8.8* 9.3  MG  --  2.1  --  2.0  --  1.9 1.9  PHOS  --   --  2.4* 3.2  --  3.7 4.1    GFR: Estimated Creatinine Clearance: 108.5 mL/min (by C-G formula based on SCr of 0.86 mg/dL). Liver Function Tests: Recent Labs  Lab 05/20/21 2024 05/23/21 1516 05/24/21 0455 05/25/21 0838 05/26/21 0532  AST 53* 63* 46* 39 31  ALT 46* 47* 36 35 34  ALKPHOS 104 99 80 80 78  BILITOT 0.3 1.3* 1.2 1.0 0.5  PROT 7.8 7.9 6.5 6.7 7.3  ALBUMIN 3.8 3.8 3.2* 3.2* 3.4*    Recent Labs  Lab 05/20/21 2024  LIPASE 78*    No results for input(s): AMMONIA in the last 168 hours. Coagulation Profile: No results for input(s): INR, PROTIME in the last 168 hours. Cardiac Enzymes: No results for input(s): CKTOTAL, CKMB, CKMBINDEX, TROPONINI in the last 168 hours. BNP (last 3 results) No results for input(s): PROBNP in the last 8760 hours. HbA1C: No results for input(s): HGBA1C in the last 72 hours. CBG: Recent Labs  Lab 05/25/21 1118 05/25/21 1655 05/25/21 2141 05/26/21 0729 05/26/21 1136  GLUCAP 150* 98 137* 105* 112*    Lipid Profile: No results for input(s): CHOL, HDL, LDLCALC, TRIG, CHOLHDL, LDLDIRECT in the last 72 hours. Thyroid Function Tests: No results for input(s): TSH, T4TOTAL, FREET4, T3FREE, THYROIDAB in the last 72 hours. Anemia Panel: No results for input(s): VITAMINB12, FOLATE, FERRITIN, TIBC, IRON, RETICCTPCT in the last 72 hours. Sepsis Labs: No results for input(s): PROCALCITON, LATICACIDVEN in the last 168 hours.  Recent Results (from the past 240 hour(s))  Resp Panel by RT-PCR (Flu A&B, Covid) Nasopharyngeal Swab     Status: None   Collection  Time: 05/23/21 11:00 PM   Specimen: Nasopharyngeal Swab; Nasopharyngeal(NP) swabs in vial transport medium  Result Value Ref Range Status   SARS Coronavirus 2 by RT PCR NEGATIVE NEGATIVE Final    Comment: (NOTE) SARS-CoV-2 target nucleic acids are NOT DETECTED.  The SARS-CoV-2 RNA is generally detectable in upper respiratory specimens during the acute phase of infection. The lowest concentration of SARS-CoV-2 viral copies this assay can detect is 138 copies/mL. A negative result does not preclude SARS-Cov-2 infection and should not be used as the sole basis for treatment or other patient management decisions. A negative result may occur with  improper specimen collection/handling, submission of specimen other than nasopharyngeal swab, presence of viral mutation(s) within the areas targeted by this assay, and inadequate number of viral copies(<138 copies/mL). A negative result must be combined with clinical observations, patient history, and epidemiological information. The expected result is Negative.  Fact Sheet for Patients:  BloggerCourse.comhttps://www.fda.gov/media/152166/download  Fact Sheet for Healthcare Providers:  SeriousBroker.ithttps://www.fda.gov/media/152162/download  This test is no t yet approved or cleared by the [place] FDA and  has been authorized for detection and/or diagnosis of SARS-CoV-2 by FDA under an Emergency Use Authorization (EUA). This EUA will remain  in effect (meaning this test can be used) for the duration of the COVID-19 declaration under Section 564(b)(1) of the Act, 21 U.S.C.section 360bbb-3(b)(1), unless the authorization is terminated  or revoked sooner.       Influenza A by PCR NEGATIVE NEGATIVE Final   Influenza B by PCR NEGATIVE NEGATIVE Final    Comment: (NOTE) The Xpert Xpress SARS-CoV-2/FLU/RSV plus assay is intended as an aid in the diagnosis of influenza from Nasopharyngeal swab specimens and should not be used as a sole basis for treatment. Nasal washings  and aspirates are unacceptable for Xpert Xpress SARS-CoV-2/FLU/RSV testing.  Fact Sheet for Patients: BloggerCourse.comhttps://www.fda.gov/media/152166/download  Fact Sheet for Healthcare Providers: SeriousBroker.ithttps://www.fda.gov/media/152162/download  This test  is not yet approved or cleared by the [place] and has been authorized for detection and/or diagnosis of SARS-CoV-2 by FDA under an Emergency Use Authorization (EUA). This EUA will remain in effect (meaning this test can be used) for the duration of the COVID-19 declaration under Section 564(b)(1) of the Act, 21 U.S.C. section 360bbb-3(b)(1), unless the authorization is terminated or revoked.  Performed at [facility], [address], [place], [place] [address]      RN Pressure Injury Documentation:     Estimated body mass index is 24.37 kg/m as calculated from the following:   Height as of this encounter: 5\' 9"  (1.753 m).   Weight as of this encounter: 74.8 kg.  Malnutrition Type:   Malnutrition Characteristics:   Nutrition Interventions:     Radiology Studies: No results found.  Scheduled Meds:  enoxaparin (LOVENOX) injection  40 mg Subcutaneous Q24H   folic acid  1 mg Oral Daily   insulin aspart  0-15 Units Subcutaneous TID WC   insulin aspart  0-5 Units Subcutaneous QHS   multivitamin with minerals  1 tablet Oral Daily   nicotine  21 mg Transdermal Daily   thiamine  100 mg Oral Daily   Or   thiamine  100 mg Intravenous Daily   Continuous Infusions:   LOS: 2 days   , DO [facility] PAGER is on AMION  If 7PM-7AM, please contact night-coverage [email or web address]

## 2021-05-26 NOTE — Progress Notes (Addendum)
Discontinued patient's Iv d/t area being red, warm, painful and possibly infected. Notified provider on call. He stated to apply a warm compress and give tylenol for pain. He also stated that he would notify Dr. Marland Mcalpine in the morning. Will continue to monitor.

## 2021-05-27 LAB — PHOSPHORUS: Phosphorus: 4.1 mg/dL (ref 2.5–4.6)

## 2021-05-27 LAB — CBC WITH DIFFERENTIAL/PLATELET
Abs Immature Granulocytes: 0.03 10*3/uL (ref 0.00–0.07)
Basophils Absolute: 0.1 10*3/uL (ref 0.0–0.1)
Basophils Relative: 1 %
Eosinophils Absolute: 0.2 10*3/uL (ref 0.0–0.5)
Eosinophils Relative: 2 %
HCT: 44.6 % (ref 39.0–52.0)
Hemoglobin: 14.6 g/dL (ref 13.0–17.0)
Immature Granulocytes: 0 %
Lymphocytes Relative: 20 %
Lymphs Abs: 1.5 10*3/uL (ref 0.7–4.0)
MCH: 34.3 pg — ABNORMAL HIGH (ref 26.0–34.0)
MCHC: 32.7 g/dL (ref 30.0–36.0)
MCV: 104.7 fL — ABNORMAL HIGH (ref 80.0–100.0)
Monocytes Absolute: 0.8 10*3/uL (ref 0.1–1.0)
Monocytes Relative: 11 %
Neutro Abs: 5 10*3/uL (ref 1.7–7.7)
Neutrophils Relative %: 66 %
Platelets: 179 10*3/uL (ref 150–400)
RBC: 4.26 MIL/uL (ref 4.22–5.81)
RDW: 12.3 % (ref 11.5–15.5)
WBC: 7.6 10*3/uL (ref 4.0–10.5)
nRBC: 0 % (ref 0.0–0.2)

## 2021-05-27 LAB — COMPREHENSIVE METABOLIC PANEL
ALT: 39 U/L (ref 0–44)
AST: 34 U/L (ref 15–41)
Albumin: 3.6 g/dL (ref 3.5–5.0)
Alkaline Phosphatase: 73 U/L (ref 38–126)
Anion gap: 9 (ref 5–15)
BUN: 8 mg/dL (ref 6–20)
CO2: 27 mmol/L (ref 22–32)
Calcium: 9.4 mg/dL (ref 8.9–10.3)
Chloride: 102 mmol/L (ref 98–111)
Creatinine, Ser: 0.89 mg/dL (ref 0.61–1.24)
GFR, Estimated: >60 mL/min (ref >60)
Glucose, Bld: 98 mg/dL (ref 70–99)
Potassium: 3.9 mmol/L (ref 3.5–5.1)
Sodium: 138 mmol/L (ref 135–145)
Total Bilirubin: 0.4 mg/dL (ref 0.3–1.2)
Total Protein: 7.6 g/dL (ref 6.5–8.1)

## 2021-05-27 LAB — MAGNESIUM: Magnesium: 1.8 mg/dL (ref 1.7–2.4)

## 2021-05-27 LAB — GLUCOSE, CAPILLARY
Glucose-Capillary: 92 mg/dL (ref 70–99)
Glucose-Capillary: 114 mg/dL — ABNORMAL HIGH (ref 70–99)
Glucose-Capillary: 120 mg/dL — ABNORMAL HIGH (ref 70–99)
Glucose-Capillary: 147 mg/dL — ABNORMAL HIGH (ref 70–99)

## 2021-05-27 MED ORDER — MELATONIN 3 MG PO TABS
3.0000 mg | ORAL_TABLET | Freq: Every day | ORAL | Status: DC
  Administered 2021-05-27: 3 mg via ORAL
  Filled 2021-05-27 (×2): qty 1

## 2021-05-27 MED ORDER — MAGNESIUM SULFATE 2 GM/50ML IV SOLN
2.0000 g | Freq: Once | INTRAVENOUS | Status: AC
  Administered 2021-05-27: 2 g via INTRAVENOUS
  Filled 2021-05-27: qty 50

## 2021-05-27 NOTE — Plan of Care (Signed)
  Problem: Education: Goal: Knowledge of disease or condition will improve Outcome: Progressing   Problem: Physical Regulation: Goal: Complications related to the disease process, condition or treatment will be avoided or minimized Outcome: Progressing   Problem: Safety: Goal: Ability to remain free from injury will improve Outcome: Progressing   Problem: Coping: Goal: Level of anxiety will decrease Outcome: Progressing   Problem: Safety: Goal: Ability to remain free from injury will improve Outcome: Progressing

## 2021-05-27 NOTE — Progress Notes (Signed)
PROGRESS NOTE    Francisco Houston  KKX:[id number] DOB: 05-Oct-1976 DOA: 05/23/2021 PCP: Pcp, No   Brief Narrative:  The patient is a 45 year old Caucasian male with a past medical history significant for but not limited to alcohol abuse who drinks daily as well as homelessness and other comorbidities who presented to the ED with alcohol withdrawal symptoms.  He stated that his last drink was the day before he presented.  He felt like he was having similar withdrawal symptoms I considered it in the past with tremors and hallucinations.  He was given 2 mg of Ativan in the ED and additional 2 mg of Ativan and was placed on a CIWA protocol.  He was given 2 L normal saline boluses and admitted for alcohol withdrawal and delirium tremens. He is improving but still seeing "orbs" and having tremors but not as bad. Also will add Nicotine Patch for his Tobacco Abuse.  His withdrawal symptoms seem to be improving significantly as his CIWA score was 12 yesterday and was 3 this morning.  He appeared calmer and does not have any more tremors today.  Assessment & Plan:   Principal Problem:   DTs (delirium tremens) (HCC) Active Problems:   Alcohol abuse   Alcohol withdrawal syndrome without complication (HCC)   Alcohol withdrawal (HCC)  DTs in the Setting of Alcohol Abuse with Withdrawal  -Presented with EtOH withdrawal, CIWA pathway with scheduled Lorazepam -Patient drinks Daily and last drink was Day before admission -C/w MVI+Minerals, Thiamine and Folic Acid -Continue to Monitor CIWA per Protocol -Will nee Social Work assistance for Resources and will D/C to [facility] on 05/29/21  Abnormal LFTs/Elevated LFTs, improved and resolved  -In the setting of EtOH Abuse -Improving. Patient's AST went from 63 -> 46 -> 39  -> 31 -> 34 and ALT went from 47 -> 36 -> 35 -> 34 -> 39 -Continue to Monitor and Trend -Repeat CMP in the AM   Hypophosphatemia -Mild. Phos Level is improved to 4.1 again -Continue to Monitor and  Trend -Repeat CMP intermittently   Tobacco Abuse -Smokes 1 PPD -Smoking Cessation Counseling given -Provided Nicotine Patch 21 mg TD  Hypokalemia -Patient's K+ was 3.4 and improved to 3.9 again -Continue to Monitor and Replete as Necessary -Repeat CMP intermittently   Hyperbilirubinemia, improving  -Likely Reactive -Pateint's T Bili went from 0.3 -> 1.3 -> 1.2 -> 1.0 -> 0.5 -> 0.4 -Continue to Monitor and Trend -Repeat CMP intermittently   Macrocytosis w/o  current Anemia -Patient's Hgb/Hct wnet from 15.1/4.2 -> 15.4/44.7 -> 14.0/42.3 -> 14.0/42.0 -> 7/0.86 -> 8/0.89 and has an MCV of 104.7 -Check B12 and Folate Level in the AM  -Continue to Monitor for S/Sx of Bleeding; No overt Bleeding noted -Repeat CBC intermittently  Thromobocytopenia  -Patient's Platelet Count went from 173 -> 155 -> 149 -> 155 -> 163 -> 179 -Continue to Monitor for S/Sx of Bleeding; No overt bleeding noted -Repeat CBC intermittently   DVT prophylaxis: Enoxparin 40 mg sq q24h Code Status: FULL CODE  Family Communication: No family present at bedside  Disposition Plan: Pending Further Clinical Improvement in Withdrawal Sx  Status is: Inpatient   The patient will require care spanning > 2 midnights and should be moved to inpatient because: Unsafe d/c plan, IV treatments appropriate due to intensity of illness or inability to take PO, and Inpatient level of care appropriate due to severity of illness  Dispo: The patient is from: Home  Anticipated d/c is to: Home              Patient currently is not medically stable to d/c.   Difficult to place patient No  Consultants:  None  Procedures: None  Antimicrobials:  Anti-infectives (From admission, onward)    None        Subjective: Seen and examined at bedside and he is no longer having tremors. Feels better. No CP or SOB. Still having numbness in toes but not as bad. No other concerns or complaints at this time.    Objective: Vitals:   05/26/21 0616 05/26/21 1449 05/26/21 2106 05/27/21 0522  BP: 137/89 (!) 163/99 131/86 126/78  Pulse: (!) 57 60 65 68  Resp: 16 20 17 18   Temp: (!) 97.5 F (36.4 C) 98.4 F (36.9 C) 98.1 F (36.7 C) 98.1 F (36.7 C)  TempSrc: Oral Oral Oral Oral  SpO2: 99% 98% 100% 99%  Weight:      Height:        Intake/Output Summary (Last 24 hours) at 05/27/2021 1256 Last data filed at 05/27/2021 0518 Gross per 24 hour  Intake 360 ml  Output --  Net 360 ml    Filed Weights   05/23/21 1120  Weight: 74.8 kg   Examination: Physical Exam:  Constitutional: WN/WD Caucasian male in NAD and appears calm and comfortable Eyes: Lids and conjunctivae normal, sclerae anicteric  ENMT: External Ears, Nose appear normal. Grossly normal hearing.  Neck: Appears normal, supple, no cervical masses, normal ROM, no appreciable thyromegaly Respiratory: Diminished to auscultation bilaterally, no wheezing, rales, rhonchi or crackles. Normal respiratory effort and patient is not tachypenic. No accessory muscle use.  Unlabored breathing Cardiovascular: RRR, no murmurs / rubs / gallops. S1 and S2 auscultated. No extremity edema.  Abdomen: Soft, non-tender, non-distended. Bowel sounds positive.  GU: Deferred. Musculoskeletal: No clubbing / cyanosis of digits/nails. No joint deformity upper and lower extremities.  Skin: No rashes, lesions, ulcers on limited skin evaluation. No induration; Warm and dry.  Neurologic: CN 2-12 grossly intact with no focal deficits.  Did not have any better tremors Psychiatric: Normal judgment and insight. Alert and oriented x 3.  Calm mood and appropriate affect.    Data Reviewed: I have personally reviewed following labs and imaging studies  CBC: Recent Labs  Lab 05/20/21 2024 05/23/21 1516 05/24/21 0455 05/25/21 0838 05/26/21 0532 05/27/21 0545  WBC 7.4 9.6 6.3 5.7 6.4 7.6  NEUTROABS 3.9  --   --  4.0 4.2 5.0  HGB 15.1 15.4 14.0 14.0 14.4 14.6  HCT  42.4 44.7 42.3 42.0 43.2 44.6  MCV 99.3 102.3* 104.2* 102.9* 103.8* 104.7*  PLT 173 155 149* 155 163 179    Basic Metabolic Panel: Recent Labs  Lab 05/23/21 1516 05/23/21 2315 05/24/21 0452 05/24/21 0455 05/25/21 0838 05/26/21 0532 05/27/21 0545  NA 136  --   --  136 136 139 138  K 3.9  --   --  3.7 3.4* 3.9 3.9  CL 95*  --   --  98 98 102 102  CO2 26  --   --  28 26 28 27   GLUCOSE 77  --   --  90 163* 104* 98  BUN <5*  --   --  <5* 5* 7 8  CREATININE 0.59*  --   --  0.58* 0.70 0.86 0.89  CALCIUM 9.6  --   --  9.1 8.8* 9.3 9.4  MG 2.1  --  2.0  --  1.9 1.9 1.8  PHOS  --  2.4* 3.2  --  3.7 4.1 4.1    GFR: Estimated Creatinine Clearance: 104.8 mL/min (by C-G formula based on SCr of 0.89 mg/dL). Liver Function Tests: Recent Labs  Lab 05/23/21 1516 05/24/21 0455 05/25/21 0838 05/26/21 0532 05/27/21 0545  AST 63* 46* 39 31 34  ALT 47* 36 35 34 39  ALKPHOS 99 80 80 78 73  BILITOT 1.3* 1.2 1.0 0.5 0.4  PROT 7.9 6.5 6.7 7.3 7.6  ALBUMIN 3.8 3.2* 3.2* 3.4* 3.6    Recent Labs  Lab 05/20/21 2024  LIPASE 78*    No results for input(s): AMMONIA in the last 168 hours. Coagulation Profile: No results for input(s): INR, PROTIME in the last 168 hours. Cardiac Enzymes: No results for input(s): CKTOTAL, CKMB, CKMBINDEX, TROPONINI in the last 168 hours. BNP (last 3 results) No results for input(s): PROBNP in the last 8760 hours. HbA1C: No results for input(s): HGBA1C in the last 72 hours. CBG: Recent Labs  Lab 05/26/21 1136 05/26/21 1646 05/26/21 2109 05/27/21 0745 05/27/21 1158  GLUCAP 112* 129* 158* 92 114*    Lipid Profile: No results for input(s): CHOL, HDL, LDLCALC, TRIG, CHOLHDL, LDLDIRECT in the last 72 hours. Thyroid Function Tests: No results for input(s): TSH, T4TOTAL, FREET4, T3FREE, THYROIDAB in the last 72 hours. Anemia Panel: No results for input(s): VITAMINB12, FOLATE, FERRITIN, TIBC, IRON, RETICCTPCT in the last 72 hours. Sepsis Labs: No  results for input(s): PROCALCITON, LATICACIDVEN in the last 168 hours.  Recent Results (from the past 240 hour(s))  Resp Panel by RT-PCR (Flu A&B, Covid) Nasopharyngeal Swab     Status: None   Collection Time: 05/23/21 11:00 PM   Specimen: Nasopharyngeal Swab; Nasopharyngeal(NP) swabs in vial transport medium  Result Value Ref Range Status   SARS Coronavirus 2 by RT PCR NEGATIVE NEGATIVE Final    Comment: (NOTE) SARS-CoV-2 target nucleic acids are NOT DETECTED.  The SARS-CoV-2 RNA is generally detectable in upper respiratory specimens during the acute phase of infection. The lowest concentration of SARS-CoV-2 viral copies this assay can detect is 138 copies/mL. A negative result does not preclude SARS-Cov-2 infection and should not be used as the sole basis for treatment or other patient management decisions. A negative result may occur with  improper specimen collection/handling, submission of specimen other than nasopharyngeal swab, presence of viral mutation(s) within the areas targeted by this assay, and inadequate number of viral copies(<138 copies/mL). A negative result must be combined with clinical observations, patient history, and epidemiological information. The expected result is Negative.  Fact Sheet for Patients:  BloggerCourse.com  Fact Sheet for Healthcare Providers:  SeriousBroker.it  This test is no t yet approved or cleared by the [place] FDA and  has been authorized for detection and/or diagnosis of SARS-CoV-2 by FDA under an Emergency Use Authorization (EUA). This EUA will remain  in effect (meaning this test can be used) for the duration of the COVID-19 declaration under Section 564(b)(1) of the Act, 21 U.S.C.section 360bbb-3(b)(1), unless the authorization is terminated  or revoked sooner.       Influenza A by PCR NEGATIVE NEGATIVE Final   Influenza B by PCR NEGATIVE NEGATIVE Final    Comment:  (NOTE) The Xpert Xpress SARS-CoV-2/FLU/RSV plus assay is intended as an aid in the diagnosis of influenza from Nasopharyngeal swab specimens and should not be used as a sole basis for treatment. Nasal washings and aspirates are unacceptable for Xpert Xpress SARS-CoV-2/FLU/RSV testing.  Fact  Sheet for Patients: BloggerCourse.com  Fact Sheet for Healthcare Providers: SeriousBroker.it  This test is not yet approved or cleared by the [place] FDA and has been authorized for detection and/or diagnosis of SARS-CoV-2 by FDA under an Emergency Use Authorization (EUA). This EUA will remain in effect (meaning this test can be used) for the duration of the COVID-19 declaration under Section 564(b)(1) of the Act, 21 U.S.C. section 360bbb-3(b)(1), unless the authorization is terminated or revoked.  Performed at [facility], [address] [place], [place] [address]      RN Pressure Injury Documentation:     Estimated body mass index is 24.37 kg/m as calculated from the following:   Height as of this encounter: 5\' 9"  (1.753 m).   Weight as of this encounter: 74.8 kg.  Malnutrition Type:   Malnutrition Characteristics:   Nutrition Interventions:     Radiology Studies: No results found.  Scheduled Meds:  enoxaparin (LOVENOX) injection  40 mg Subcutaneous Q24H   folic acid  1 mg Oral Daily   insulin aspart  0-15 Units Subcutaneous TID WC   insulin aspart  0-5 Units Subcutaneous QHS   multivitamin with minerals  1 tablet Oral Daily   nicotine  21 mg Transdermal Daily   thiamine  100 mg Oral Daily   Or   thiamine  100 mg Intravenous Daily   Continuous Infusions:  magnesium sulfate bolus IVPB 2 g (05/27/21 1211)    LOS: 3 days   05/29/21, DO [facility] PAGER is on AMION  If 7PM-7AM, please contact night-coverage [email or web address]

## 2021-05-28 LAB — GLUCOSE, CAPILLARY
Glucose-Capillary: 96 mg/dL (ref 70–99)
Glucose-Capillary: 100 mg/dL — ABNORMAL HIGH (ref 70–99)
Glucose-Capillary: 97 mg/dL (ref 70–99)
Glucose-Capillary: 106 mg/dL — ABNORMAL HIGH (ref 70–99)
Glucose-Capillary: 122 mg/dL — ABNORMAL HIGH (ref 70–99)

## 2021-05-28 MED ORDER — TRAZODONE HCL 50 MG PO TABS
50.0000 mg | ORAL_TABLET | Freq: Every evening | ORAL | Status: DC | PRN
  Administered 2021-05-28: 50 mg via ORAL
  Filled 2021-05-28: qty 1

## 2021-05-28 NOTE — Progress Notes (Signed)
PROGRESS NOTE    Francisco Houston  ZOX:[id number] DOB: 04-10-1976 DOA: 05/23/2021 PCP: Pcp, No   Brief Narrative:  The patient is a 45 year old Caucasian male with a past medical history significant for but not limited to alcohol abuse who drinks daily as well as homelessness and other comorbidities who presented to the ED with alcohol withdrawal symptoms.  He stated that his last drink was the day before he presented.  He felt like he was having similar withdrawal symptoms I considered it in the past with tremors and hallucinations.  He was given 2 mg of Ativan in the ED and additional 2 mg of Ativan and was placed on a CIWA protocol.  He was given 2 L normal saline boluses and admitted for alcohol withdrawal and delirium tremens. He is improving but still seeing "orbs" and having tremors but not as bad. Also will add Nicotine Patch for his Tobacco Abuse.  His withdrawal symptoms seem to be improving significantly and he seems to be out of withdrawals now. Will discharge to [facility] tomorrow AM.   Assessment & Plan:   Principal Problem:   DTs (delirium tremens) (HCC) Active Problems:   Alcohol abuse   Alcohol withdrawal syndrome without complication (HCC)   Alcohol withdrawal (HCC)  DTs in the Setting of Alcohol Abuse with Withdrawal  -Presented with EtOH withdrawal, CIWA pathway with scheduled Lorazepam -Patient drinks Daily and last drink was Day before admission -C/w MVI+Minerals, Thiamine and Folic Acid -Continue to Monitor CIWA per Protocol -Will nee Social Work assistance for Resources and will D/C to [facility] on 05/29/21  Abnormal LFTs/Elevated LFTs, improved and resolved  -In the setting of EtOH Abuse -Improving. Patient's AST went from 63 -> 46 -> 39  -> 31 -> 34 and ALT went from 47 -> 36 -> 35 -> 34 -> 39 on last check  -Continue to Monitor and Trend -Repeat CMP in the AM   Hypophosphatemia -Mild. Phos Level is improved to 4.1 on last check  -Continue to Monitor and Trend -Repeat CMP  intermittently   Tobacco Abuse -Smokes 1 PPD -Smoking Cessation Counseling given -Provided Nicotine Patch 21 mg TD  Hypokalemia -Patient's K+ was 3.4 and improved to 3.9 again -Continue to Monitor and Replete as Necessary -Repeat CMP intermittently   Hyperbilirubinemia, improving  -Likely Reactive -Pateint's T Bili went from 0.3 -> 1.3 -> 1.2 -> 1.0 -> 0.5 -> 0.4 on last check  -Continue to Monitor and Trend -Repeat CMP intermittently   Macrocytosis w/o current Anemia -In the setting of Alcoholism  -Patient's Hgb/Hct went from 15.1/4.2 -> 15.4/44.7 -> 14.0/42.3 -> 14.0/42.0 -> 7/0.86 -> 8/0.89 and has an MCV of 104.7 -Check B12 and Folate Level in the outpatient setting  -Continue to Monitor for S/Sx of Bleeding; No overt Bleeding noted -Repeat CBC intermittently  Thrombocytopenia, improving  -Patient's Platelet Count went from 173 -> 155 -> 149 -> 155 -> 163 -> 179 on last check -Continue to Monitor for S/Sx of Bleeding; No overt bleeding noted -Repeat CBC intermittently   Insomnia -Melatonin not working -Will start Trazodone 50 mg qHSprn  DVT prophylaxis: Enoxparin 40 mg sq q24h Code Status: FULL CODE  Family Communication: No family present at bedside  Disposition Plan: Pending Further Clinical Improvement in Withdrawal Sx  Status is: Inpatient   The patient will require care spanning > 2 midnights and should be moved to inpatient because: Unsafe d/c plan, IV treatments appropriate due to intensity of illness or inability to take PO, and Inpatient level  of care appropriate due to severity of illness  Dispo: The patient is from: Home              Anticipated d/c is to: Home              Patient currently is not medically stable to d/c.   Difficult to place patient No  Consultants:  None  Procedures: None  Antimicrobials:  Anti-infectives (From admission, onward)    None        Subjective: Seen and examined at bedside and he is doing well. Had some  tremors last night but resolved this AM. CIWA score normal per nursing. No CP or SOB. States he could not sleep last night. Still has some numbness in his toes but it is improving. No other concerns or complaints at this time.   Objective: Vitals:   05/27/21 2000 05/27/21 2100 05/27/21 2152 05/28/21 0541  BP:   (!) 143/101 (!) 135/93  Pulse:   (!) 58 (!) 56  Resp: (!) 23 (!) 26  20  Temp:   (!) 97.5 F (36.4 C) 98.3 F (36.8 C)  TempSrc:   Oral Oral  SpO2:   99% 100%  Weight:      Height:        Intake/Output Summary (Last 24 hours) at 05/28/2021 0855 Last data filed at 05/27/2021 1400 Gross per 24 hour  Intake 50 ml  Output --  Net 50 ml    Filed Weights   05/23/21 1120  Weight: 74.8 kg   Examination: Physical Exam:  Constitutional: WN/WD Caucasian male in NAD and appears calm and comfortable Eyes: Lids and conjunctivae normal, sclerae anicteric  ENMT: External Ears, Nose appear normal. Grossly normal hearing.  Neck: Appears normal, supple, no cervical masses, normal ROM, no appreciable thyromegaly; no JVD Respiratory: Diminished to auscultation bilaterally, no wheezing, rales, rhonchi or crackles. Normal respiratory effort and patient is not tachypenic. No accessory muscle use. Unlabored Breathing   Cardiovascular: RRR, no murmurs / rubs / gallops. S1 and S2 auscultated. No extremity edema Abdomen: Soft, non-tender, non-distended. Bowel sounds positive.  GU: Deferred. Musculoskeletal: No clubbing / cyanosis of digits/nails. No joint deformity upper and lower extremities. Skin: No rashes, lesions, ulcers on a limited skin evaluation. No induration; Warm and dry.  Neurologic: CN 2-12 grossly intact with no focal deficits. Sensation intact in all 4 Extremities, DTR normal. Strength 5/5 in all 4. Romberg sign cerebellar reflexes not assessed.  Psychiatric: Normal judgment and insight. Alert and oriented x 3. Calm mood and appropriate affect.   Data Reviewed: I have personally  reviewed following labs and imaging studies  CBC: Recent Labs  Lab 05/23/21 1516 05/24/21 0455 05/25/21 0838 05/26/21 0532 05/27/21 0545  WBC 9.6 6.3 5.7 6.4 7.6  NEUTROABS  --   --  4.0 4.2 5.0  HGB 15.4 14.0 14.0 14.4 14.6  HCT 44.7 42.3 42.0 43.2 44.6  MCV 102.3* 104.2* 102.9* 103.8* 104.7*  PLT 155 149* 155 163 179    Basic Metabolic Panel: Recent Labs  Lab 05/23/21 1516 05/23/21 2315 05/24/21 0452 05/24/21 0455 05/25/21 0838 05/26/21 0532 05/27/21 0545  NA 136  --   --  136 136 139 138  K 3.9  --   --  3.7 3.4* 3.9 3.9  CL 95*  --   --  98 98 102 102  CO2 26  --   --  28 26 28 27   GLUCOSE 77  --   --  90 163*  104* 98  BUN <5*  --   --  <5* 5* 7 8  CREATININE 0.59*  --   --  0.58* 0.70 0.86 0.89  CALCIUM 9.6  --   --  9.1 8.8* 9.3 9.4  MG 2.1  --  2.0  --  1.9 1.9 1.8  PHOS  --  2.4* 3.2  --  3.7 4.1 4.1    GFR: Estimated Creatinine Clearance: 104.8 mL/min (by C-G formula based on SCr of 0.89 mg/dL). Liver Function Tests: Recent Labs  Lab 05/23/21 1516 05/24/21 0455 05/25/21 0838 05/26/21 0532 05/27/21 0545  AST 63* 46* 39 31 34  ALT 47* 36 35 34 39  ALKPHOS 99 80 80 78 73  BILITOT 1.3* 1.2 1.0 0.5 0.4  PROT 7.9 6.5 6.7 7.3 7.6  ALBUMIN 3.8 3.2* 3.2* 3.4* 3.6    No results for input(s): LIPASE, AMYLASE in the last 168 hours.  No results for input(s): AMMONIA in the last 168 hours. Coagulation Profile: No results for input(s): INR, PROTIME in the last 168 hours. Cardiac Enzymes: No results for input(s): CKTOTAL, CKMB, CKMBINDEX, TROPONINI in the last 168 hours. BNP (last 3 results) No results for input(s): PROBNP in the last 8760 hours. HbA1C: No results for input(s): HGBA1C in the last 72 hours. CBG: Recent Labs  Lab 05/27/21 1158 05/27/21 1718 05/27/21 2149 05/28/21 0543 05/28/21 0729  GLUCAP 114* 120* 147* 96 100*    Lipid Profile: No results for input(s): CHOL, HDL, LDLCALC, TRIG, CHOLHDL, LDLDIRECT in the last 72 hours. Thyroid  Function Tests: No results for input(s): TSH, T4TOTAL, FREET4, T3FREE, THYROIDAB in the last 72 hours. Anemia Panel: No results for input(s): VITAMINB12, FOLATE, FERRITIN, TIBC, IRON, RETICCTPCT in the last 72 hours. Sepsis Labs: No results for input(s): PROCALCITON, LATICACIDVEN in the last 168 hours.  Recent Results (from the past 240 hour(s))  Resp Panel by RT-PCR (Flu A&B, Covid) Nasopharyngeal Swab     Status: None   Collection Time: 05/23/21 11:00 PM   Specimen: Nasopharyngeal Swab; Nasopharyngeal(NP) swabs in vial transport medium  Result Value Ref Range Status   SARS Coronavirus 2 by RT PCR NEGATIVE NEGATIVE Final    Comment: (NOTE) SARS-CoV-2 target nucleic acids are NOT DETECTED.  The SARS-CoV-2 RNA is generally detectable in upper respiratory specimens during the acute phase of infection. The lowest concentration of SARS-CoV-2 viral copies this assay can detect is 138 copies/mL. A negative result does not preclude SARS-Cov-2 infection and should not be used as the sole basis for treatment or other patient management decisions. A negative result may occur with  improper specimen collection/handling, submission of specimen other than nasopharyngeal swab, presence of viral mutation(s) within the areas targeted by this assay, and inadequate number of viral copies(<138 copies/mL). A negative result must be combined with clinical observations, patient history, and epidemiological information. The expected result is Negative.  Fact Sheet for Patients:  BloggerCourse.com  Fact Sheet for Healthcare Providers:  SeriousBroker.it  This test is no t yet approved or cleared by the [place] FDA and  has been authorized for detection and/or diagnosis of SARS-CoV-2 by FDA under an Emergency Use Authorization (EUA). This EUA will remain  in effect (meaning this test can be used) for the duration of the COVID-19 declaration under  Section 564(b)(1) of the Act, 21 U.S.C.section 360bbb-3(b)(1), unless the authorization is terminated  or revoked sooner.       Influenza A by PCR NEGATIVE NEGATIVE Final   Influenza B by PCR NEGATIVE  NEGATIVE Final    Comment: (NOTE) The Xpert Xpress SARS-CoV-2/FLU/RSV plus assay is intended as an aid in the diagnosis of influenza from Nasopharyngeal swab specimens and should not be used as a sole basis for treatment. Nasal washings and aspirates are unacceptable for Xpert Xpress SARS-CoV-2/FLU/RSV testing.  Fact Sheet for Patients: BloggerCourse.com  Fact Sheet for Healthcare Providers: SeriousBroker.it  This test is not yet approved or cleared by the [place] FDA and has been authorized for detection and/or diagnosis of SARS-CoV-2 by FDA under an Emergency Use Authorization (EUA). This EUA will remain in effect (meaning this test can be used) for the duration of the COVID-19 declaration under Section 564(b)(1) of the Act, 21 U.S.C. section 360bbb-3(b)(1), unless the authorization is terminated or revoked.  Performed at [facility], [address], [place], [place] [address]      RN Pressure Injury Documentation:     Estimated body mass index is 24.37 kg/m as calculated from the following:   Height as of this encounter: 5\' 9"  (1.753 m).   Weight as of this encounter: 74.8 kg.  Malnutrition Type:   Malnutrition Characteristics:   Nutrition Interventions:     Radiology Studies: No results found.  Scheduled Meds:  enoxaparin (LOVENOX) injection  40 mg Subcutaneous Q24H   folic acid  1 mg Oral Daily   insulin aspart  0-15 Units Subcutaneous TID WC   insulin aspart  0-5 Units Subcutaneous QHS   melatonin  3 mg Oral QHS   multivitamin with minerals  1 tablet Oral Daily   nicotine  21 mg Transdermal Daily   thiamine  100 mg Oral Daily   Or   thiamine  100 mg Intravenous Daily    Continuous Infusions:   LOS: 4 days   , DO [facility] PAGER is on AMION  If 7PM-7AM, please contact night-coverage [email or web address]

## 2021-05-29 LAB — GLUCOSE, CAPILLARY
Glucose-Capillary: 114 mg/dL — ABNORMAL HIGH (ref 70–99)
Glucose-Capillary: 100 mg/dL — ABNORMAL HIGH (ref 70–99)

## 2021-05-29 MED ORDER — GABAPENTIN 300 MG PO CAPS: 300.0000 mg | ORAL_CAPSULE | Freq: Three times a day (TID) | ORAL | 0 refills | Status: AC

## 2021-05-29 MED ORDER — FOLIC ACID 1 MG PO TABS: 1.0000 mg | ORAL_TABLET | Freq: Every day | ORAL | 0 refills | Status: AC

## 2021-05-29 MED ORDER — PANTOPRAZOLE SODIUM 40 MG PO TBEC: 40.0000 mg | DELAYED_RELEASE_TABLET | Freq: Every day | ORAL | 0 refills | Status: AC

## 2021-05-29 MED ORDER — GABAPENTIN 300 MG PO CAPS: 300.0000 mg | ORAL_CAPSULE | Freq: Three times a day (TID) | ORAL | 0 refills | Status: DC

## 2021-05-29 MED ORDER — THIAMINE HCL 100 MG PO TABS: 100.0000 mg | ORAL_TABLET | Freq: Every day | ORAL | 0 refills | Status: AC

## 2021-05-29 MED ORDER — ADULT MULTIVITAMIN W/MINERALS CH: 1.0000 | ORAL_TABLET | Freq: Every day | ORAL | 0 refills | Status: AC

## 2021-05-29 MED ORDER — AMLODIPINE BESYLATE 10 MG PO TABS: 10.0000 mg | ORAL_TABLET | Freq: Every day | ORAL | 0 refills | Status: DC

## 2021-05-29 MED ORDER — PANTOPRAZOLE SODIUM 40 MG PO TBEC: 40.0000 mg | DELAYED_RELEASE_TABLET | Freq: Every day | ORAL | 0 refills | Status: DC

## 2021-05-29 MED ORDER — AMLODIPINE BESYLATE 10 MG PO TABS: 10.0000 mg | ORAL_TABLET | Freq: Every day | ORAL | 0 refills | Status: AC

## 2021-05-29 MED ORDER — ONDANSETRON HCL 4 MG PO TABS: 4.0000 mg | ORAL_TABLET | Freq: Four times a day (QID) | ORAL | 0 refills | Status: AC | PRN

## 2021-05-29 MED ORDER — MIRTAZAPINE 15 MG PO TABS: 15.0000 mg | ORAL_TABLET | Freq: Every day | ORAL | 0 refills | Status: AC

## 2021-05-29 MED ORDER — NICOTINE 21 MG/24HR TD PT24: 21.0000 mg | MEDICATED_PATCH | Freq: Every day | TRANSDERMAL | 0 refills | Status: AC

## 2021-05-29 MED ORDER — MIRTAZAPINE 15 MG PO TABS: 15.0000 mg | ORAL_TABLET | Freq: Every day | ORAL | 0 refills | Status: DC

## 2021-05-29 NOTE — Discharge Summary (Signed)
Physician Discharge Summary  Francisco Houston ZOX:[id number] DOB: [birth year] DOA: 05/23/2021  PCP: Pcp, No  Admit date: 05/23/2021 Discharge date: 05/29/2021  Admitted From: Home Disposition: Home with [facility]  Recommendations for Outpatient Follow-up:  Follow up with PCP in 1-2 weeks Avoid Alcohol Please obtain CMP/CBC, Mag, Phos in one week Please follow up on the following pending results:  Home Health: No Equipment/Devices: None    Discharge Condition: Stable CODE STATUS: FULL CODE Diet recommendation: Heart Healthy Discharge  Brief/Interim Summary: The patient is a 45 year old Caucasian male with a past medical history significant for but not limited to alcohol abuse who drinks daily as well as homelessness and other comorbidities who presented to the ED with alcohol withdrawal symptoms.  He stated that his last drink was the day before he presented.  He felt like he was having similar withdrawal symptoms I considered it in the past with tremors and hallucinations.  He was given 2 mg of Ativan in the ED and additional 2 mg of Ativan and was placed on a CIWA protocol.  He was given 2 L normal saline boluses and admitted for alcohol withdrawal and delirium tremens. He is improving but still seeing "orbs" and having tremors but not as bad. Also will add Nicotine Patch for his Tobacco Abuse.  His withdrawal symptoms seem to be improving significantly and he seems to be out of withdrawals now. Stable to D/C to [facility] today as he is significantly improved.   Discharge Diagnoses:  Principal Problem:   DTs (delirium tremens) (HCC) Active Problems:   Alcohol abuse   Alcohol withdrawal syndrome without complication (HCC)   Alcohol withdrawal (HCC)  DTs in the Setting of Alcohol Abuse with Withdrawal -Presented with EtOH withdrawal, CIWA pathway with scheduled Lorazepam -Patient drinks Daily and last drink was Day before admission -C/w MVI+Minerals, Thiamine and Folic Acid at D/C -Continue to  Monitor CIWA per Protocol and is now improved from a withdrawal standoint  -Will nee Social Work assistance for Resources and will D/C to [facility] on 05/29/21   Abnormal LFTs/Elevated LFTs, improved and resolved -In the setting of EtOH Abuse -Improving. Patient's AST went from 63 -> 46 -> 39  -> 31 -> 34 and ALT went from 47 -> 36 -> 35 -> 34 -> 39 on last check -Continue to Monitor and Trend -Repeat CMP within 1 week    Hypophosphatemia -Mild. Phos Level is improved to 4.1 on last check  -Continue to Monitor and Trend -Repeat CMP intermittently and repeat in the in the outpatient setting    Tobacco Abuse -Smokes 1 PPD -Smoking Cessation Counseling given -Provided Nicotine Patch 21 mg TD   Hypokalemia -Patient's K+ was 3.4 and improved to 3.9 again -Continue to Monitor and Replete as Necessary -Repeat CMP intermittently   Hyperbilirubinemia, improving -Likely Reactive -Pateint's T Bili went from 0.3 -> 1.3 -> 1.2 -> 1.0 -> 0.5 -> 0.4 on last check -Continue to Monitor and Trend -Repeat CMP intermittently  and repeat in the in the outpatient setting    Macrocytosis w/o current Anemia -In the setting of Alcoholism  -Patient's Hgb/Hct went from 15.1/4.2 -> 15.4/44.7 -> 14.0/42.3 -> 14.0/42.0 -> 7/0.86 -> 8/0.89 and has an MCV of 104.7 -Check B12 and Folate Level in the outpatient setting  -Continue to Monitor for S/Sx of Bleeding; No overt Bleeding noted -Repeat CBC intermittently and repeat in the in the outpatient setting    Thrombocytopenia, improving  -Patient's Platelet Count went from 173 -> 155 -> 149 ->  155 -> 163 -> 179 on last check -Continue to Monitor for S/Sx of Bleeding; No overt bleeding noted -Repeat CBC intermittently and  repeat in the in the outpatient setting    Insomnia -Melatonin not working -Will start Trazodone 50 mg qHSprn -Continue with mirtazapine 50 mg p.o. nightly at discharge  Hypertension -Blood pressures been relatively well controlled -Resume  blood pressure medications  GERD  -continue with PPI at discharge   Discharge Instructions Discharge Instructions     Call MD for:  difficulty breathing, headache or visual disturbances   Complete by: As directed    Call MD for:  extreme fatigue   Complete by: As directed    Call MD for:  hives   Complete by: As directed    Call MD for:  persistant dizziness or light-headedness   Complete by: As directed    Call MD for:  persistant nausea and vomiting   Complete by: As directed    Call MD for:  redness, tenderness, or signs of infection (pain, swelling, redness, odor or green/yellow discharge around incision site)   Complete by: As directed    Call MD for:  severe uncontrolled pain   Complete by: As directed    Call MD for:  temperature >100.4   Complete by: As directed    Diet - low sodium heart healthy   Complete by: As directed    Discharge instructions   Complete by: As directed    You were cared for by a hospitalist during your hospital stay. If you have any questions about your discharge medications or the care you received while you were in the hospital after you are discharged, you can call the unit and ask to speak with the hospitalist on call if the hospitalist that took care of you is not available. Once you are discharged, your primary care physician will handle any further medical issues. Please note that NO REFILLS for any discharge medications will be authorized once you are discharged, as it is imperative that you return to your primary care physician (or establish a relationship with a primary care physician if you do not have one) for your aftercare needs so that they can reassess your need for medications and monitor your lab values.  Follow up with PCP within 1-2 weeks. Take all medications as prescribed. If symptoms change or worsen please return to the ED for evaluation   Increase activity slowly   Complete by: As directed       Allergies as of 05/29/2021        Reactions   Other Itching, Rash, Other (See Comments)   THE PATIENT HAS SENSITIVE SKIN AND WILL ITCH AND BREAK OUT IF EXPOSED TO VARIOUS IRRITANTS        Medication List     STOP taking these medications    nicotine polacrilex 2 MG gum Commonly known as: NICORETTE       TAKE these medications    amLODipine 10 MG tablet Commonly known as: NORVASC Take 1 tablet (10 mg total) by mouth daily.   folic acid 1 MG tablet Commonly known as: FOLVITE Take 1 tablet (1 mg total) by mouth daily.   gabapentin 300 MG capsule Commonly known as: NEURONTIN Take 1 capsule (300 mg total) by mouth 3 (three) times daily.   mirtazapine 15 MG tablet Commonly known as: REMERON Take 1 tablet (15 mg total) by mouth at bedtime.   multivitamin with minerals Tabs tablet Take 1 tablet by mouth daily.  nicotine 21 mg/24hr patch Commonly known as: NICODERM CQ - dosed in mg/24 hours Place 1 patch (21 mg total) onto the skin daily.   ondansetron 4 MG tablet Commonly known as: ZOFRAN Take 1 tablet (4 mg total) by mouth every 6 (six) hours as needed for nausea.   pantoprazole 40 MG tablet Commonly known as: PROTONIX Take 1 tablet (40 mg total) by mouth daily.   thiamine 100 MG tablet Take 1 tablet (100 mg total) by mouth daily.        Follow-up Information     [facility] Follow up.   Why: go directly after discharge Contact information: [address] [place] [address] tel#[phone] M-Friday 8a-2p               Allergies  Allergen Reactions   Other Itching, Rash and Other (See Comments)    THE PATIENT HAS SENSITIVE SKIN AND WILL ITCH AND BREAK OUT IF EXPOSED TO VARIOUS IRRITANTS   Consultations: None  Procedures/Studies: DG Chest 2 View  Result Date: 05/20/2021 CLINICAL DATA:  Patient states he is going through alcohol withdrawal, endorses chest pain. Last drink was before police brought him to the ED. Best images attainable due to pt  condition. EXAM: CHEST - 2 VIEW COMPARISON:  05/19/2021 FINDINGS: Normal heart, mediastinum and hila. Clear lungs.  No pleural effusion or pneumothorax. Skeletal structures are intact. IMPRESSION: No active cardiopulmonary disease. Electronically Signed   By: Amie Portland M.D.   On: 05/20/2021 17:39   DG Chest 2 View  Result Date: 05/19/2021 CLINICAL DATA:  45 year old male with chest pain. EXAM: CHEST - 2 VIEW COMPARISON:  None. FINDINGS: The lungs are clear. There is no pleural effusion pneumothorax. The cardiac silhouette is within normal limits. No acute osseous pathology. IMPRESSION: No active cardiopulmonary disease. Electronically Signed   By: Elgie Collard M.D.   On: 05/19/2021 21:22   DG Elbow 2 Views Left  Result Date: 04/30/2021 CLINICAL DATA:  Patient fell and hit elbow 1 and 1/2 weeks ago. EXAM: LEFT ELBOW - 2 VIEW COMPARISON:  None. FINDINGS: There is soft tissue swelling along the dorsal aspect of the olecranon. No acute fracture or subluxation. No radiopaque foreign body or soft tissue gas. IMPRESSION: Soft tissue swelling.  No acute fracture. Electronically Signed   By: Norva Pavlov M.D.   On: 04/30/2021 16:42     Subjective: Seen and examined at bedside and states he is doing well.  States he did not sleep as well last night.  Had some slight tremors this morning but this is now resolved.  Complains of some neuropathy in his feet and numbness in his toes but states it is getting better since admission.  No other concerns or complaints at this time.  Discharge Exam: Vitals:   05/28/21 2002 05/29/21 0531  BP: (!) 138/97 126/84  Pulse: (!) 56 (!) 56  Resp: 19 14  Temp: 98.2 F (36.8 C) 98.3 F (36.8 C)  SpO2: 97% 99%   Vitals:   05/28/21 0541 05/28/21 1636 05/28/21 2002 05/29/21 0531  BP: (!) 135/93 (!) 131/93 (!) 138/97 126/84  Pulse: (!) 56 (!) 57 (!) 56 (!) 56  Resp: 20 20 19 14   Temp: 98.3 F (36.8 C) 98.1 F (36.7 C) 98.2 F (36.8 C) 98.3 F (36.8 C)   TempSrc: Oral Oral Oral Oral  SpO2: 100% 99% 97% 99%  Weight:      Height:       General: Pt  is alert, awake, not in acute distress Cardiovascular: RRR, S1/S2 +, no rubs, no gallops Respiratory: Mildly diminished bilaterally, no wheezing, no rhonchi; unlabored breathing and not wearing any supplemental oxygen via nasal cannula Abdominal: Soft, NT, ND, bowel sounds + Extremities: No appreciable edema, no cyanosis  The results of significant diagnostics from this hospitalization (including imaging, microbiology, ancillary and laboratory) are listed below for reference.    Microbiology: Recent Results (from the past 240 hour(s))  Resp Panel by RT-PCR (Flu A&B, Covid) Nasopharyngeal Swab     Status: None   Collection Time: 05/23/21 11:00 PM   Specimen: Nasopharyngeal Swab; Nasopharyngeal(NP) swabs in vial transport medium  Result Value Ref Range Status   SARS Coronavirus 2 by RT PCR NEGATIVE NEGATIVE Final    Comment: (NOTE) SARS-CoV-2 target nucleic acids are NOT DETECTED.  The SARS-CoV-2 RNA is generally detectable in upper respiratory specimens during the acute phase of infection. The lowest concentration of SARS-CoV-2 viral copies this assay can detect is 138 copies/mL. A negative result does not preclude SARS-Cov-2 infection and should not be used as the sole basis for treatment or other patient management decisions. A negative result may occur with  improper specimen collection/handling, submission of specimen other than nasopharyngeal swab, presence of viral mutation(s) within the areas targeted by this assay, and inadequate number of viral copies(<138 copies/mL). A negative result must be combined with clinical observations, patient history, and epidemiological information. The expected result is Negative.  Fact Sheet for Patients:  BloggerCourse.com  Fact Sheet for Healthcare Providers:  SeriousBroker.it  This test is  no t yet approved or cleared by the [place] FDA and  has been authorized for detection and/or diagnosis of SARS-CoV-2 by FDA under an Emergency Use Authorization (EUA). This EUA will remain  in effect (meaning this test can be used) for the duration of the COVID-19 declaration under Section 564(b)(1) of the Act, 21 U.S.C.section 360bbb-3(b)(1), unless the authorization is terminated  or revoked sooner.       Influenza A by PCR NEGATIVE NEGATIVE Final   Influenza B by PCR NEGATIVE NEGATIVE Final    Comment: (NOTE) The Xpert Xpress SARS-CoV-2/FLU/RSV plus assay is intended as an aid in the diagnosis of influenza from Nasopharyngeal swab specimens and should not be used as a sole basis for treatment. Nasal washings and aspirates are unacceptable for Xpert Xpress SARS-CoV-2/FLU/RSV testing.  Fact Sheet for Patients: BloggerCourse.com  Fact Sheet for Healthcare Providers: SeriousBroker.it  This test is not yet approved or cleared by the [place] FDA and has been authorized for detection and/or diagnosis of SARS-CoV-2 by FDA under an Emergency Use Authorization (EUA). This EUA will remain in effect (meaning this test can be used) for the duration of the COVID-19 declaration under Section 564(b)(1) of the Act, 21 U.S.C. section 360bbb-3(b)(1), unless the authorization is terminated or revoked.  Performed at [facility], [address], [place], [place] [address]     Labs: BNP (last 3 results) No results for input(s): BNP in the last 8760 hours. Basic Metabolic Panel: Recent Labs  Lab 05/23/21 1516 05/23/21 2315 05/24/21 0452 05/24/21 0455 05/25/21 0838 05/26/21 0532 05/27/21 0545  NA 136  --   --  136 136 139 138  K 3.9  --   --  3.7 3.4* 3.9 3.9  CL 95*  --   --  98 98 102 102  CO2 26  --   --  GLUCOSE 77  --   --  90 163* 104* 98  BUN <5*  --   --  <5* 5* 7 8  CREATININE  0.59*  --   --  0.58* 0.70 0.86 0.89  CALCIUM 9.6  --   --  9.1 8.8* 9.3 9.4  MG 2.1  --  2.0  --  1.9 1.9 1.8  PHOS  --  2.4* 3.2  --  3.7 4.1 4.1   Liver Function Tests: Recent Labs  Lab 05/23/21 1516 05/24/21 0455 05/25/21 0838 05/26/21 0532 05/27/21 0545  AST 63* 46* 39 31 34  ALT 47* 36 35 34 39  ALKPHOS 99 80 80 78 73  BILITOT 1.3* 1.2 1.0 0.5 0.4  PROT 7.9 6.5 6.7 7.3 7.6  ALBUMIN 3.8 3.2* 3.2* 3.4* 3.6   No results for input(s): LIPASE, AMYLASE in the last 168 hours. No results for input(s): AMMONIA in the last 168 hours. CBC: Recent Labs  Lab 05/23/21 1516 05/24/21 0455 05/25/21 0838 05/26/21 0532 05/27/21 0545  WBC 9.6 6.3 5.7 6.4 7.6  NEUTROABS  --   --  4.0 4.2 5.0  HGB 15.4 14.0 14.0 14.4 14.6  HCT 44.7 42.3 42.0 43.2 44.6  MCV 102.3* 104.2* 102.9* 103.8* 104.7*  PLT 155 149* 155 163 179   Cardiac Enzymes: No results for input(s): CKTOTAL, CKMB, CKMBINDEX, TROPONINI in the last 168 hours. BNP: Invalid input(s): POCBNP CBG: Recent Labs  Lab 05/28/21 0729 05/28/21 1140 05/28/21 1657 05/28/21 2006 05/29/21 0721  GLUCAP 100* 97 106* 122* 114*   D-Dimer No results for input(s): DDIMER in the last 72 hours. Hgb A1c No results for input(s): HGBA1C in the last 72 hours. Lipid Profile No results for input(s): CHOL, HDL, LDLCALC, TRIG, CHOLHDL, LDLDIRECT in the last 72 hours. Thyroid function studies No results for input(s): TSH, T4TOTAL, T3FREE, THYROIDAB in the last 72 hours.  Invalid input(s): FREET3 Anemia work up No results for input(s): VITAMINB12, FOLATE, FERRITIN, TIBC, IRON, RETICCTPCT in the last 72 hours. Urinalysis    Component Value Date/Time   COLORURINE STRAW (A) 03/13/2021 1730   APPEARANCEUR CLEAR 03/13/2021 1730   LABSPEC 1.002 (L) 03/13/2021 1730   PHURINE 6.0 03/13/2021 1730   GLUCOSEU NEGATIVE 03/13/2021 1730   HGBUR SMALL (A) 03/13/2021 1730   BILIRUBINUR NEGATIVE 03/13/2021 1730   KETONESUR NEGATIVE 03/13/2021 1730    PROTEINUR 30 (A) 03/13/2021 1730   NITRITE NEGATIVE 03/13/2021 1730   LEUKOCYTESUR NEGATIVE 03/13/2021 1730   Sepsis Labs Invalid input(s): PROCALCITONIN,  WBC,  LACTICIDVEN Microbiology Recent Results (from the past 240 hour(s))  Resp Panel by RT-PCR (Flu A&B, Covid) Nasopharyngeal Swab     Status: None   Collection Time: 05/23/21 11:00 PM   Specimen: Nasopharyngeal Swab; Nasopharyngeal(NP) swabs in vial transport medium  Result Value Ref Range Status   SARS Coronavirus 2 by RT PCR NEGATIVE NEGATIVE Final    Comment: (NOTE) SARS-CoV-2 target nucleic acids are NOT DETECTED.  The SARS-CoV-2 RNA is generally detectable in upper respiratory specimens during the acute phase of infection. The lowest concentration of SARS-CoV-2 viral copies this assay can detect is 138 copies/mL. A negative result does not preclude SARS-Cov-2 infection and should not be used as the sole basis for treatment or other patient management decisions. A negative result may occur with  improper specimen collection/handling, submission of specimen other than nasopharyngeal swab, presence of viral mutation(s) within the areas targeted by this assay, and inadequate number of viral copies(<138 copies/mL). A negative result must be combined with clinical observations, patient history, and  epidemiological information. The expected result is Negative.  Fact Sheet for Patients:  BloggerCourse.com  Fact Sheet for Healthcare Providers:  SeriousBroker.it  This test is no t yet approved or cleared by the [place] FDA and  has been authorized for detection and/or diagnosis of SARS-CoV-2 by FDA under an Emergency Use Authorization (EUA). This EUA will remain  in effect (meaning this test can be used) for the duration of the COVID-19 declaration under Section 564(b)(1) of the Act, 21 U.S.C.section 360bbb-3(b)(1), unless the authorization is terminated  or revoked  sooner.       Influenza A by PCR NEGATIVE NEGATIVE Final   Influenza B by PCR NEGATIVE NEGATIVE Final    Comment: (NOTE) The Xpert Xpress SARS-CoV-2/FLU/RSV plus assay is intended as an aid in the diagnosis of influenza from Nasopharyngeal swab specimens and should not be used as a sole basis for treatment. Nasal washings and aspirates are unacceptable for Xpert Xpress SARS-CoV-2/FLU/RSV testing.  Fact Sheet for Patients: BloggerCourse.com  Fact Sheet for Healthcare Providers: SeriousBroker.it  This test is not yet approved or cleared by the [place] FDA and has been authorized for detection and/or diagnosis of SARS-CoV-2 by FDA under an Emergency Use Authorization (EUA). This EUA will remain in effect (meaning this test can be used) for the duration of the COVID-19 declaration under Section 564(b)(1) of the Act, 21 U.S.C. section 360bbb-3(b)(1), unless the authorization is terminated or revoked.  Performed at [facility], [address], [place], [place] [address]    Time coordinating discharge: 35 minutes  SIGNED:  Merlene Laughter, DO [facility] 05/29/2021, 9:09 AM Pager is on AMION  If 7PM-7AM, please contact night-coverage [email or web address]

## 2021-05-29 NOTE — TOC Transition Note (Signed)
Transition of Care [facility]) - CM/SW Discharge Note   Patient Details  Name: Francisco Houston MRN: [id number] Date of Birth: 04/09/1976  Transition of Care [facility]) CM/SW Contact:  Lanier Clam, RN Phone Number: 05/29/2021, 10:48 AM   Clinical Narrative:[facility] shelter,bus pass in shadow chart-Nsg aware to give to patient for transport to [facility]. No further CM needs.       Final next level of care: Homeless Shelter Barriers to Discharge: No Barriers Identified   Patient Goals and CMS Choice Patient states their goals for this hospitalization and ongoing recovery are:: return back to tent or [facility] CMS Medicare.gov Compare Post Acute Care list provided to:: Patient    Discharge Placement                       Discharge Plan and Services   Discharge Planning Services: CM Consult Post Acute Care Choice:  (Tent/IRC)                               Social Determinants of Health (SDOH) Interventions SDOH Interventions for the Following Domains: Alcohol Usage, Housing Housing Interventions: [id number] Referral Alcohol Brief Interventions/Follow-up: Alcohol education/Brief advice   Readmission Risk Interventions No flowsheet data found.

## 2021-05-29 NOTE — Plan of Care (Signed)
  Problem: Education: Goal: Knowledge of disease or condition will improve Outcome: Adequate for Discharge Goal: Understanding of discharge needs will improve Outcome: Adequate for Discharge   Problem: Health Behavior/Discharge Planning: Goal: Ability to identify changes in lifestyle to reduce recurrence of condition will improve Outcome: Adequate for Discharge Goal: Identification of resources available to assist in meeting health care needs will improve Outcome: Adequate for Discharge   Problem: Physical Regulation: Goal: Complications related to the disease process, condition or treatment will be avoided or minimized Outcome: Adequate for Discharge   Problem: Safety: Goal: Ability to remain free from injury will improve Outcome: Adequate for Discharge   Problem: Education: Goal: Knowledge of General Education information will improve Description: Including pain rating scale, medication(s)/side effects and non-pharmacologic comfort measures Outcome: Adequate for Discharge   Problem: Health Behavior/Discharge Planning: Goal: Ability to manage health-related needs will improve Outcome: Adequate for Discharge   Problem: Clinical Measurements: Goal: Ability to maintain clinical measurements within normal limits will improve Outcome: Adequate for Discharge Goal: Will remain free from infection Outcome: Adequate for Discharge Goal: Diagnostic test results will improve Outcome: Adequate for Discharge Goal: Respiratory complications will improve Outcome: Adequate for Discharge Goal: Cardiovascular complication will be avoided Outcome: Adequate for Discharge   Problem: Activity: Goal: Risk for activity intolerance will decrease Outcome: Adequate for Discharge   Problem: Nutrition: Goal: Adequate nutrition will be maintained Outcome: Adequate for Discharge   Problem: Coping: Goal: Level of anxiety will decrease Outcome: Adequate for Discharge   Problem:  Elimination: Goal: Will not experience complications related to bowel motility Outcome: Adequate for Discharge Goal: Will not experience complications related to urinary retention Outcome: Adequate for Discharge   Problem: Pain Managment: Goal: General experience of comfort will improve Outcome: Adequate for Discharge   Problem: Safety: Goal: Ability to remain free from injury will improve Outcome: Adequate for Discharge   Problem: Skin Integrity: Goal: Risk for impaired skin integrity will decrease Outcome: Adequate for Discharge   

## 2021-05-29 NOTE — Progress Notes (Signed)
Patient given discharge, follow up, and medication instructions, verbalized understanding, IV and telemetry monitor removed, prescriptions with patient, bus pass given as well

## 2023-02-05 IMAGING — CR DG CHEST 2V
2 series · 2 of 2 positions shown · non-contrast
Comparison: 05/19/2021

CLINICAL DATA: Patient states he is going through alcohol
withdrawal, endorses chest pain. Last drink was before police
brought him to the ED. Best images attainable due to pt condition.

EXAM:
CHEST - 2 VIEW

[w chest pa]
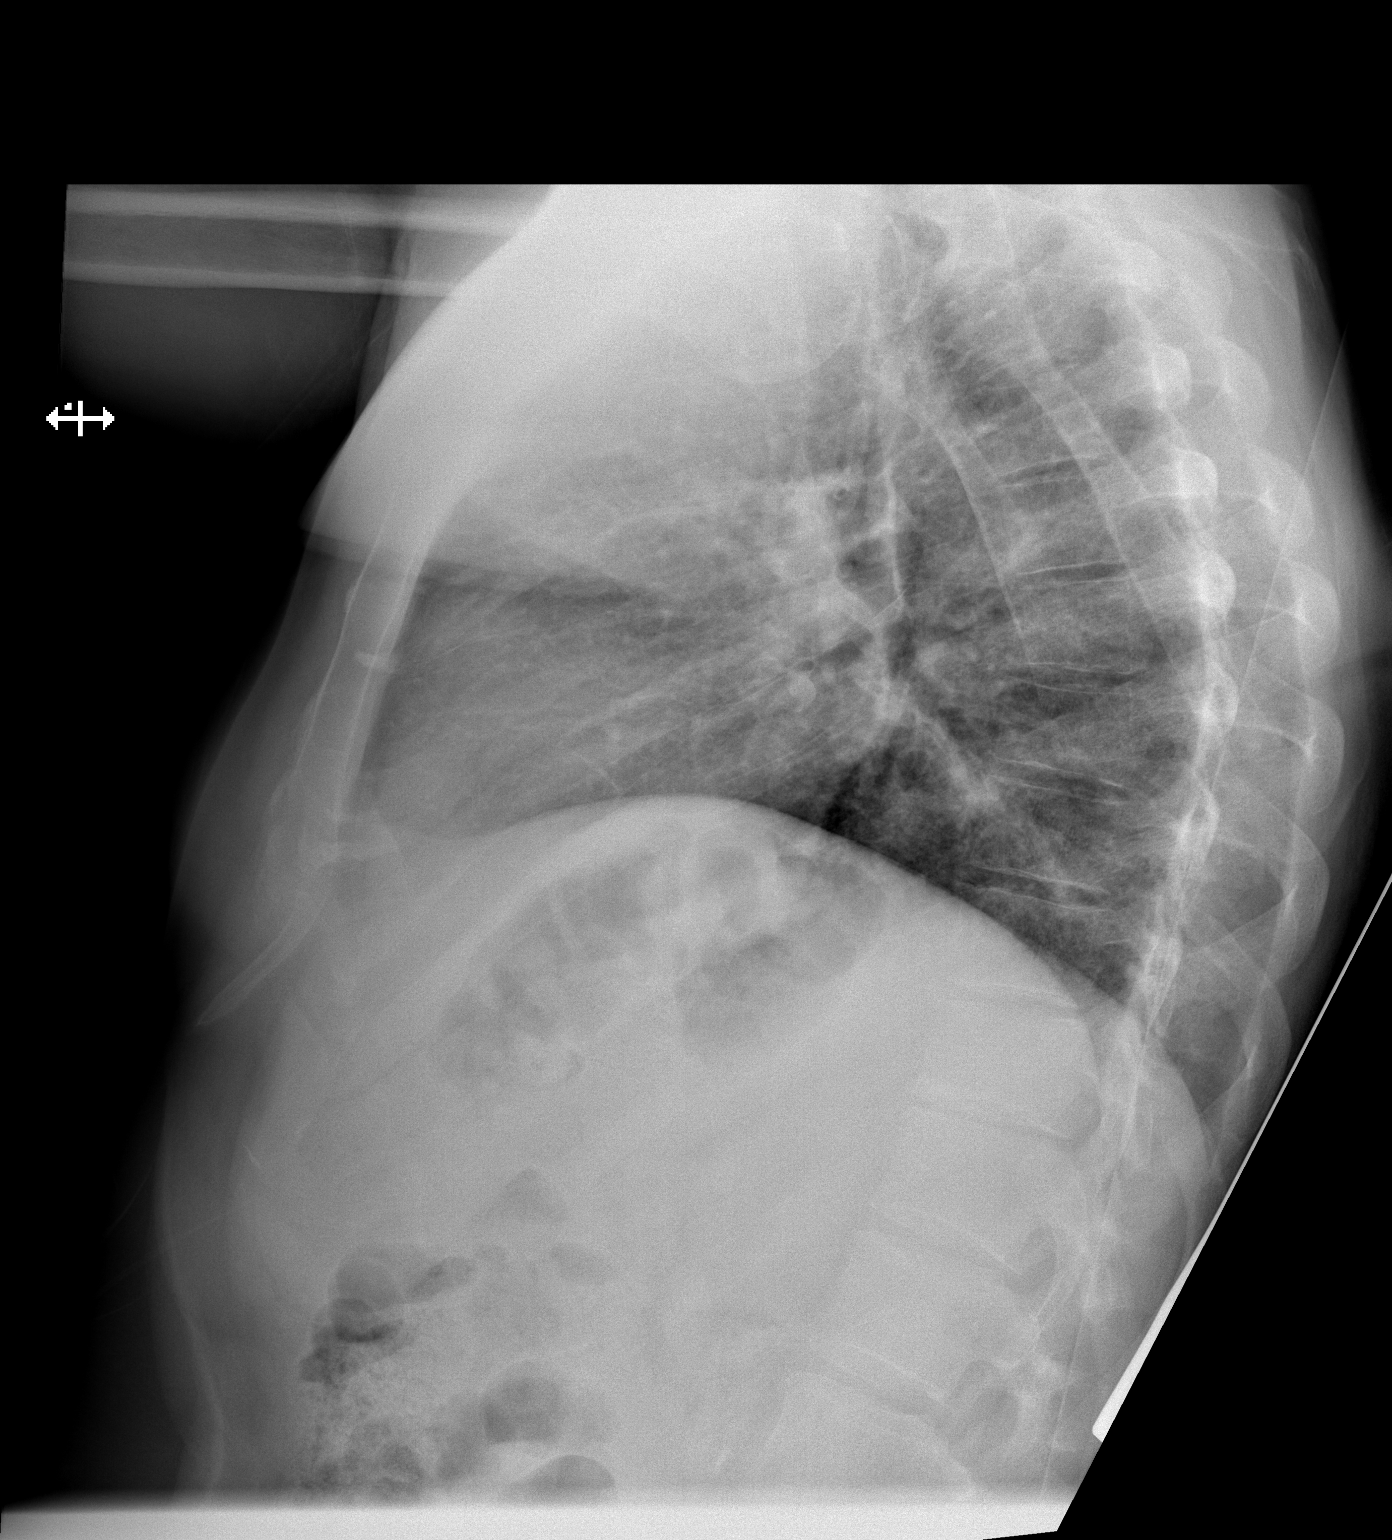

[x chest ap]
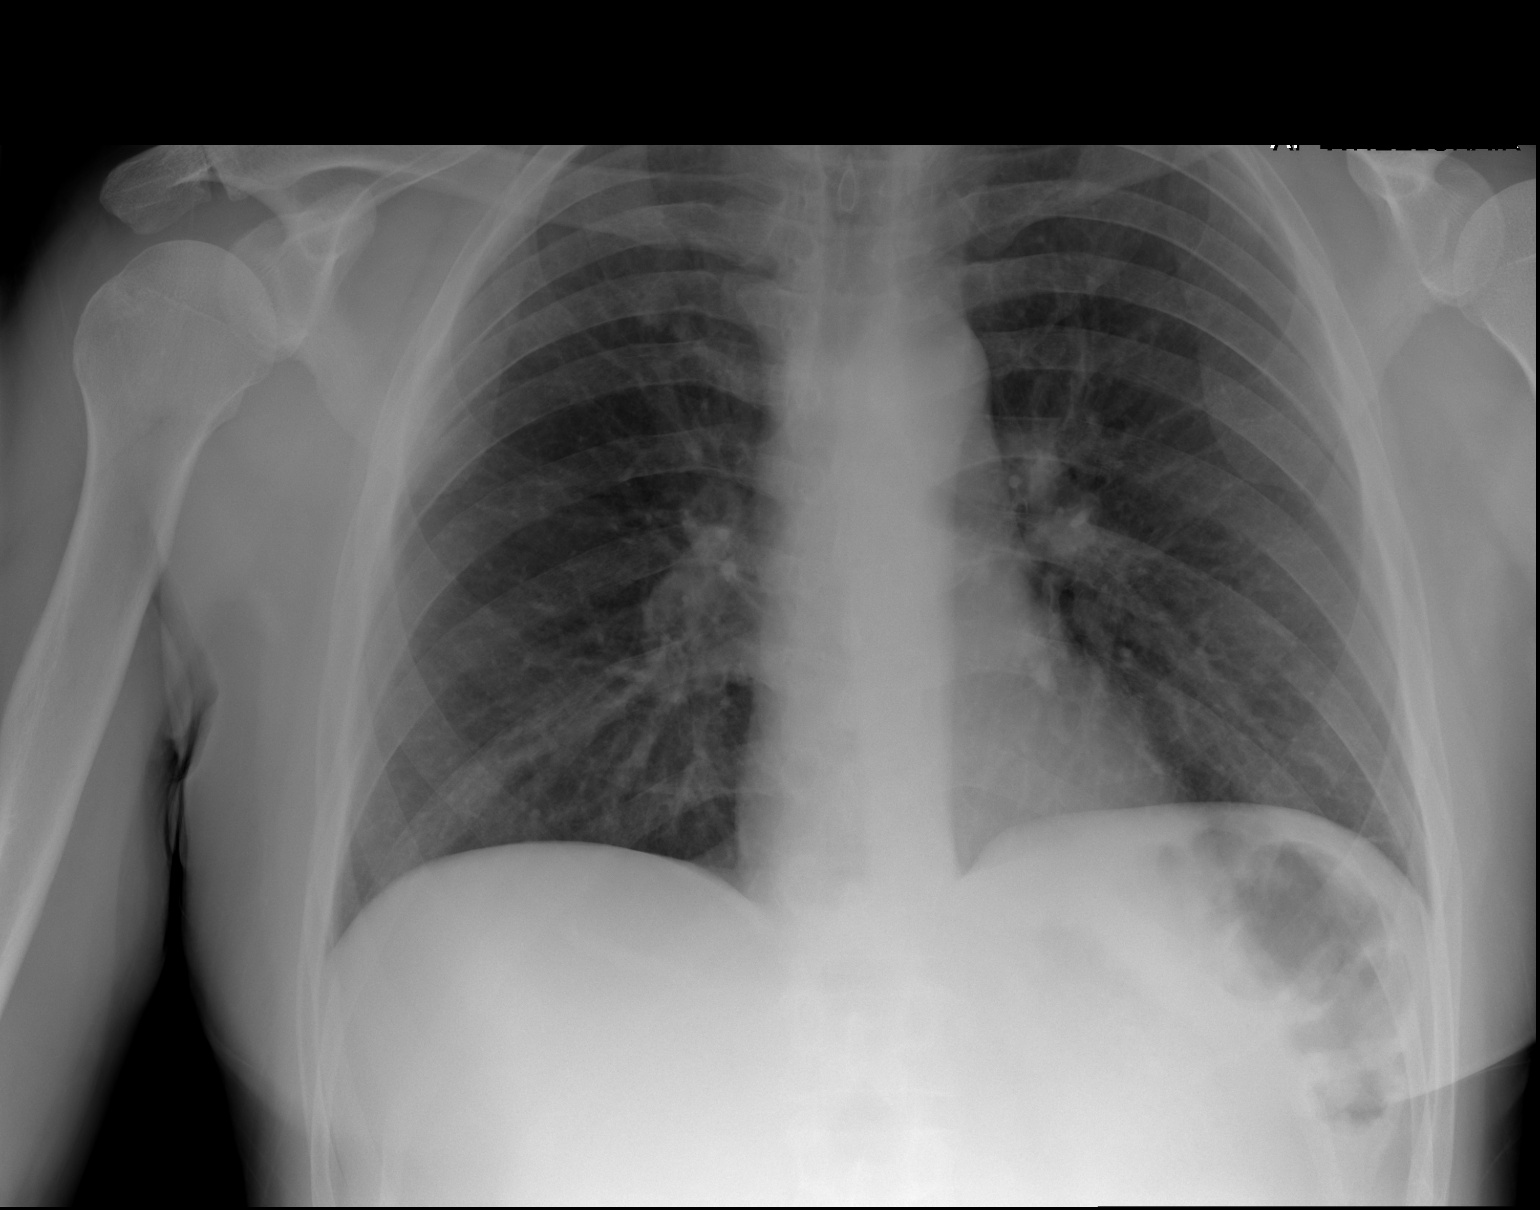

[2 of 2 positions shown; findings below may reference images not displayed]

FINDINGS: Normal heart, mediastinum and hila.

Clear lungs.  No pleural effusion or pneumothorax.

Skeletal structures are intact.
IMPRESSION: No active cardiopulmonary disease.
